# Patient Record
Sex: Male | Born: 1990 | Race: Black or African American | Hispanic: No | Marital: Single | Smoking: Current every day smoker
Health system: Southern US, Community
[De-identification: ages and names within clinical notes are randomized; demographics above are authoritative.]

## PROBLEM LIST (undated history)

## (undated) ENCOUNTER — Emergency Department (HOSPITAL_COMMUNITY): Payer: Self-pay | Source: Home / Self Care

## (undated) DIAGNOSIS — R569 Unspecified convulsions: Secondary | ICD-10-CM

## (undated) HISTORY — PX: OTHER SURGICAL HISTORY: SHX169

---
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
## 2013-01-18 ENCOUNTER — Encounter (HOSPITAL_COMMUNITY): Payer: Self-pay | Admitting: Emergency Medicine

## 2013-01-18 ENCOUNTER — Emergency Department (HOSPITAL_COMMUNITY)
Admission: EM | Admit: 2013-01-18 | Discharge: 2013-01-18 | Disposition: A | Discharge status: Home / Self Care | Payer: 59 | Attending: Emergency Medicine | Admitting: Emergency Medicine

## 2013-01-18 DIAGNOSIS — A084 Viral intestinal infection, unspecified: Secondary | ICD-10-CM

## 2013-01-18 DIAGNOSIS — A088 Other specified intestinal infections: Secondary | ICD-10-CM | POA: Insufficient documentation

## 2013-01-18 LAB — COMPREHENSIVE METABOLIC PANEL WITH GFR
ALT: 14 U/L (ref 0–53)
AST: 17 U/L (ref 0–37)
Albumin: 4.2 g/dL (ref 3.5–5.2)
Alkaline Phosphatase: 83 U/L (ref 39–117)
BUN: 12 mg/dL (ref 6–23)
CO2: 23 meq/L (ref 19–32)
Calcium: 9.6 mg/dL (ref 8.4–10.5)
Chloride: 100 meq/L (ref 96–112)
Creatinine, Ser: 0.93 mg/dL (ref 0.50–1.35)
GFR calc Af Amer: >90 mL/min (ref >90)
GFR calc non Af Amer: >90 mL/min (ref >90)
Glucose, Bld: 161 mg/dL — ABNORMAL HIGH (ref 70–99)
Potassium: 4 meq/L (ref 3.5–5.1)
Sodium: 137 meq/L (ref 135–145)
Total Bilirubin: 0.9 mg/dL (ref 0.3–1.2)
Total Protein: 8.5 g/dL — ABNORMAL HIGH (ref 6.0–8.3)

## 2013-01-18 LAB — CBC WITH DIFFERENTIAL/PLATELET
Basophils Absolute: 0 K/uL (ref 0.0–0.1)
Basophils Relative: 0 % (ref 0–1)
Eosinophils Absolute: 0 K/uL (ref 0.0–0.7)
Eosinophils Relative: 0 % (ref 0–5)
HCT: 42.3 % (ref 39.0–52.0)
Hemoglobin: 15.2 g/dL (ref 13.0–17.0)
Lymphocytes Relative: 4 % — ABNORMAL LOW (ref 12–46)
Lymphs Abs: 0.5 K/uL — ABNORMAL LOW (ref 0.7–4.0)
MCH: 31.3 pg (ref 26.0–34.0)
MCHC: 35.9 g/dL (ref 30.0–36.0)
MCV: 87 fL (ref 78.0–100.0)
Monocytes Absolute: 0.4 K/uL (ref 0.1–1.0)
Monocytes Relative: 3 % (ref 3–12)
Neutro Abs: 10.4 K/uL — ABNORMAL HIGH (ref 1.7–7.7)
Neutrophils Relative %: 92 % — ABNORMAL HIGH (ref 43–77)
Platelets: 297 K/uL (ref 150–400)
RBC: 4.86 MIL/uL (ref 4.22–5.81)
RDW: 12.3 % (ref 11.5–15.5)
WBC: 11.2 K/uL — ABNORMAL HIGH (ref 4.0–10.5)

## 2013-01-18 LAB — LIPASE, BLOOD: Lipase: 15 U/L (ref 11–59)

## 2013-01-18 MED ORDER — ONDANSETRON HCL 4 MG PO TABS: 4.0000 mg | ORAL_TABLET | Freq: Four times a day (QID) | ORAL | Status: DC

## 2013-01-18 MED ORDER — ONDANSETRON HCL 4 MG/2ML IJ SOLN
4.0000 mg | INTRAMUSCULAR | Status: AC
  Administered 2013-01-18: 4 mg via INTRAVENOUS
  Filled 2013-01-18: qty 2

## 2013-01-18 MED ORDER — MORPHINE SULFATE 4 MG/ML IJ SOLN
2.0000 mg | Freq: Once | INTRAMUSCULAR | Status: AC
  Administered 2013-01-18: 2 mg via INTRAVENOUS
  Filled 2013-01-18: qty 1

## 2013-01-18 MED ORDER — SODIUM CHLORIDE 0.9 % IV BOLUS (SEPSIS)
1000.0000 mL | Freq: Once | INTRAVENOUS | Status: AC
  Administered 2013-01-18: 1000 mL via INTRAVENOUS

## 2013-01-18 NOTE — ED Notes (Signed)
Pt. reports mid abdominal pain with emesis and diarrhea onset yesterday afternoon , denies fever or chills. Denies urinary discomfort.

## 2013-01-18 NOTE — ED Provider Notes (Signed)
Medical screening examination/treatment/procedure(s) were conducted as a shared visit with non-physician practitioner(s) or resident and myself. I personally evaluated the patient during the encounter and agree with the findings and plan unless otherwise indicated.  I have personally reviewed any xrays and/ or EKG's with the provider and I agree with interpretation.  Central adbo cramping with vomit/ diarrhea, significant other starting with similar sxs. Exam well appearing, mild dry mm, no focal abdo tenderness, no guarding, no RLQ pain. Likely GE. Discussed possible early appy and return instructions.  Fluids given. Lab work unremarkable.  Vomiting, Diarrhea   Enid Skeens, MD 01/18/13 [phone]

## 2013-01-18 NOTE — ED Provider Notes (Signed)
CSN: [id number]     Arrival date & time 01/18/13  7829 History   First MD Initiated Contact with Patient 01/18/13 0303     Chief Complaint  Patient presents with  . Abdominal Pain   (Consider location/radiation/quality/duration/timing/severity/associated sxs/prior Treatment) HPI Comments: Patient is a 22 year old male with no significant past medical history who presents for abdominal cramping with associated nonbloody, nonbilious emesis x5 and nonbloody, watery diarrhea x4. Symptoms began yesterday afternoon at 4 PM and have been persistent since. Patient denies any modifying factors of his symptoms and has not tried any over-the-counter remedies. He describes his abdominal cramping as diffuse and nonradiating. He denies associated fever, chills, shortness of breath, chest pain, urinary symptoms, melena or hematochezia, weakness, and numbness or tingling. Denies hx of abdominal surgeries. Denies sick contacts.  Patient is a 22 y.o. male presenting with abdominal pain. The history is provided by the patient. No language interpreter was used.  Abdominal Pain Associated symptoms: diarrhea, nausea and vomiting   Associated symptoms: no fever     History reviewed. No pertinent past medical history. History reviewed. No pertinent past surgical history. No family history on file. History  Substance Use Topics  . Smoking status: Never Smoker   . Smokeless tobacco: Not on file  . Alcohol Use: No    Review of Systems  Constitutional: Negative for fever.  Gastrointestinal: Positive for nausea, vomiting, abdominal pain and diarrhea. Negative for blood in stool.  All other systems reviewed and are negative.    Allergies  Review of patient's allergies indicates no known allergies.  Home Medications   Current Outpatient Rx  Name  Route  Sig  Dispense  Refill  . ondansetron (ZOFRAN) 4 MG tablet   Oral   Take 1 tablet (4 mg total) by mouth every 6 (six) hours.   12 tablet   0    BP  126/65  Pulse 79  Temp(Src) 97.7 F (36.5 C) (Oral)  Resp 16  SpO2 100%  Physical Exam  Nursing note and vitals reviewed. Constitutional: He is oriented to person, place, and time. He appears well-developed and well-nourished. No distress.  HENT:  Head: Normocephalic and atraumatic.  Mouth/Throat: Oropharynx is clear and moist. No oropharyngeal exudate.  Eyes: Conjunctivae and EOM are normal. Pupils are equal, round, and reactive to light. No scleral icterus.  Neck: Normal range of motion. Neck supple.  Cardiovascular: Normal rate, regular rhythm and normal heart sounds.   Pulmonary/Chest: Effort normal. No respiratory distress. He has no wheezes. He has no rales.  Abdominal: Soft. He exhibits no distension and no mass. There is no tenderness. There is no rebound and no guarding.  No peritoneal signs or guarding.  Musculoskeletal: Normal range of motion.  Neurological: He is alert and oriented to person, place, and time.  Skin: Skin is warm and dry. No rash noted. He is not diaphoretic. No erythema. No pallor.  Psychiatric: He has a normal mood and affect. His behavior is normal.    ED Course  Procedures (including critical care time) Labs Review Labs Reviewed  CBC WITH DIFFERENTIAL - Abnormal; Notable for the following:    WBC 11.2 (*)    Neutrophils Relative % 92 (*)    Neutro Abs 10.4 (*)    Lymphocytes Relative 4 (*)    Lymphs Abs 0.5 (*)    All other components within normal limits  COMPREHENSIVE METABOLIC PANEL - Abnormal; Notable for the following:    Glucose, Bld 161 (*)    Total  Protein 8.5 (*)    All other components within normal limits  LIPASE, BLOOD   Imaging Review No results found.  EKG Interpretation   None       MDM   1. Viral gastroenteritis    Patient presents today for abdominal cramping, nausea, vomiting, and diarrhea with onset 4 PM yesterday. Patient will and nontoxic appearing, hemodynamically stable, and afebrile. Physical exam without  abdominal tenderness. Heart RRR and lungs CTAB. Labs significant for mild leukocytosis of 11.2 consistent with viral illness. No electrolyte balance and liver and kidney function preserved. Lipase normal. Patient treated in ED with Zofran and IV fluids as well as morphine for pain and to slow down bowels. Will reassess.  Patient has remained stable throughout ED course. Abdominal examinations also stable without tenderness. Do not believe emergent imaging is indicated at this time. Believe he is stable for discharge with primary care followup. Will prescribe a course of Zofran for symptomatic management. Return precautions discussed and patient agreeable to plan with no unaddressed concerns.    Antony Madura, PA-C 01/18/13 [phone]

## 2013-06-23 ENCOUNTER — Emergency Department (HOSPITAL_COMMUNITY)
Admission: EM | Admit: 2013-06-23 | Discharge: 2013-06-23 | Disposition: A | Discharge status: Home / Self Care | Payer: 59 | Attending: Emergency Medicine | Admitting: Emergency Medicine

## 2013-06-23 ENCOUNTER — Encounter (HOSPITAL_COMMUNITY): Payer: Self-pay | Admitting: Emergency Medicine

## 2013-06-23 ENCOUNTER — Emergency Department (HOSPITAL_COMMUNITY): Payer: 59

## 2013-06-23 DIAGNOSIS — Y9302 Activity, running: Secondary | ICD-10-CM | POA: Insufficient documentation

## 2013-06-23 DIAGNOSIS — R296 Repeated falls: Secondary | ICD-10-CM | POA: Insufficient documentation

## 2013-06-23 DIAGNOSIS — X500XXA Overexertion from strenuous movement or load, initial encounter: Secondary | ICD-10-CM | POA: Insufficient documentation

## 2013-06-23 DIAGNOSIS — S43005A Unspecified dislocation of left shoulder joint, initial encounter: Secondary | ICD-10-CM

## 2013-06-23 DIAGNOSIS — S43016A Anterior dislocation of unspecified humerus, initial encounter: Secondary | ICD-10-CM | POA: Insufficient documentation

## 2013-06-23 DIAGNOSIS — Y929 Unspecified place or not applicable: Secondary | ICD-10-CM | POA: Insufficient documentation

## 2013-06-23 MED ORDER — ONDANSETRON HCL 4 MG/2ML IJ SOLN
4.0000 mg | Freq: Once | INTRAMUSCULAR | Status: AC
  Administered 2013-06-23: 4 mg via INTRAVENOUS
  Filled 2013-06-23: qty 2

## 2013-06-23 MED ORDER — ETOMIDATE 2 MG/ML IV SOLN
INTRAVENOUS | Status: AC | PRN
  Administered 2013-06-23: 10 mg via INTRAVENOUS

## 2013-06-23 MED ORDER — HYDROCODONE-ACETAMINOPHEN 5-325 MG PO TABS: 2.0000 | ORAL_TABLET | ORAL | Status: DC | PRN

## 2013-06-23 MED ORDER — ETOMIDATE 2 MG/ML IV SOLN
INTRAVENOUS | Status: AC
  Filled 2013-06-23: qty 10

## 2013-06-23 MED ORDER — HYDROMORPHONE HCL PF 1 MG/ML IJ SOLN
1.0000 mg | Freq: Once | INTRAMUSCULAR | Status: AC
  Administered 2013-06-23: 1 mg via INTRAVENOUS
  Filled 2013-06-23: qty 1

## 2013-06-23 MED ORDER — SODIUM CHLORIDE 0.9 % IV SOLN
INTRAVENOUS | Status: DC
  Administered 2013-06-23: 125 mL/h via INTRAVENOUS

## 2013-06-23 MED ORDER — FENTANYL CITRATE 0.05 MG/ML IJ SOLN
50.0000 ug | Freq: Once | INTRAMUSCULAR | Status: DC
  Filled 2013-06-23 (×2): qty 2

## 2013-06-23 MED ORDER — IBUPROFEN 800 MG PO TABS: 800.0000 mg | ORAL_TABLET | Freq: Three times a day (TID) | ORAL | Status: DC

## 2013-06-23 MED ORDER — ETOMIDATE 2 MG/ML IV SOLN: 0.3000 mg/kg | Freq: Once | INTRAVENOUS | Status: DC

## 2013-06-23 NOTE — ED Provider Notes (Signed)
CSN: [id number]     Arrival date & time 06/23/13  0020 History   First MD Initiated Contact with Patient 06/23/13 0036     Chief Complaint  Patient presents with  . Shoulder Injury     (Consider location/radiation/quality/duration/timing/severity/associated sxs/prior Treatment) HPI Otherwise healthy 23 year old male. History per patient. Patient states he was running up stairs, tripped with outstretched arm injuring his left shoulder. Now presents with pain and deformity to left shoulder and severe pain with any movement. No wrist or elbow injury. No head or neck injury. Denies LOC. Pain is sharp and moderate to severe. No history of previous dislocation. No numbness or tingling.  History reviewed. No pertinent past medical history. History reviewed. No pertinent past surgical history. History reviewed. No pertinent family history. History  Substance Use Topics  . Smoking status: Never Smoker   . Smokeless tobacco: Not on file  . Alcohol Use: No    Review of Systems  Constitutional: Negative for fever and chills.  Eyes: Negative for visual disturbance.  Respiratory: Negative for shortness of breath.   Cardiovascular: Negative for chest pain.  Gastrointestinal: Negative for vomiting and abdominal pain.  Genitourinary: Negative for flank pain.  Musculoskeletal: Negative for back pain, neck pain and neck stiffness.  Skin: Negative for rash.  Neurological: Negative for weakness, numbness and headaches.  All other systems reviewed and are negative.     Allergies  Review of patient's allergies indicates no known allergies.  Home Medications   Prior to Admission medications   Not on File   BP 130/75  Pulse 66  Temp(Src) 98.5 F (36.9 C) (Oral)  Resp 10  Wt 155 lb (70.308 kg)  SpO2 100% Physical Exam  Constitutional: He is oriented to person, place, and time. He appears well-developed and well-nourished.  HENT:  Head: Normocephalic and atraumatic.  Mouth/Throat:  Oropharynx is clear and moist.  Eyes: EOM are normal. Pupils are equal, round, and reactive to light.  Neck: Neck supple.  Cardiovascular: Normal rate, regular rhythm and intact distal pulses.   Pulmonary/Chest: Effort normal and breath sounds normal. No respiratory distress. He exhibits no tenderness.  Abdominal: There is no tenderness.  Musculoskeletal:  Left upper extremity with obvious deformity to the left shoulder consistent with dislocation. Clavicle intact and nontender. Normal sensorium to light touch over the deltoid. No tenderness over elbow, wrist or hand. Distal neurovascular intact. Decreased range of motion at shoulder.  Neurological: He is alert and oriented to person, place, and time.  Skin: Skin is warm and dry.    ED Course  Procedural sedation Date/Time: 06/23/2013 1:26 AM Performed by: Sunnie NielsenPITZ, Tifany Hirsch Authorized by: Sunnie NielsenPITZ, Alecia Doi Consent: Verbal consent obtained. Risks and benefits: risks, benefits and alternatives were discussed Consent given by: patient Patient understanding: patient states understanding of the procedure being performed Patient consent: the patient's understanding of the procedure matches consent given Procedure consent: procedure consent matches procedure scheduled Required items: required blood products, implants, devices, and special equipment available Patient identity confirmed: verbally with patient Time out: Immediately prior to procedure a "time out" was called to verify the correct patient, procedure, equipment, support staff and site/side marked as required. Preparation: Patient was prepped and draped in the usual sterile fashion. Patient sedated: yes Sedation type: moderate (conscious) sedation Sedatives: etomidate Sedation end date/time: 06/23/2013 1:26 AM Vitals: Vital signs were monitored during sedation. Patient tolerance: Patient tolerated the procedure well with no immediate complications.  Reduction of dislocation Date/Time:  06/23/2013 1:26 AM Performed by: Sunnie NielsenPITZ, Link Burgeson Authorized  by: Sunnie NielsenPITZ, Ha Placeres Consent: Verbal consent obtained. Risks and benefits: risks, benefits and alternatives were discussed Consent given by: patient Patient understanding: patient states understanding of the procedure being performed Patient consent: the patient's understanding of the procedure matches consent given Procedure consent: procedure consent matches procedure scheduled Required items: required blood products, implants, devices, and special equipment available Patient identity confirmed: verbally with patient Time out: Immediately prior to procedure a "time out" was called to verify the correct patient, procedure, equipment, support staff and site/side marked as required. Preparation: Patient was prepped and draped in the usual sterile fashion. Patient tolerance: Patient tolerated the procedure well with no immediate complications. Comments: Traction applied to arm/left shoulder with audible clunk and clinical reduction. X-ray ordered.   (including critical care time) Labs Review Labs Reviewed - No data to display  Imaging Review Dg Shoulder Left  06/23/2013   CLINICAL DATA:  Left shoulder pain  EXAM: LEFT SHOULDER - 2+ VIEW  COMPARISON:  None.  FINDINGS: There is anterior dislocation of the left humeral head relative to glenoid. There is no fracture. [facility] joint is normal.  IMPRESSION: Anterior left shoulder dislocation.   Electronically Signed   By: Elige KoHetal  Patel   On: 06/23/2013 01:06   Dg Shoulder Left Port  06/23/2013   CLINICAL DATA:  Postreduction of left shoulder dislocation  EXAM: PORTABLE LEFT SHOULDER - 2+ VIEW  COMPARISON:  None.  FINDINGS: There has been successful interval reduction of the left glenohumeral dislocation. The acromioclavicular joint is normal. There is slight flattening of the posterolateral humeral head which may reflect a subtle Hill-Sachs lesion.  IMPRESSION: Interval successful reduction of left  glenohumeral dislocation.   Electronically Signed   By: Elige KoHetal  Patel   On: 06/23/2013 02:06   IV fluids. IV Dilaudid  Plan discharge home with close outpatient followup orthopedic surgery. Prescription for Motrin pain medication as needed. Return precautions provided and verbalizes understood.  MDM   Diagnosis: Left shoulder dislocation  FOOSH injury to left upper extremity. Evaluated with x-ray reviewed as above. IV Dilaudid pain control. Patient consented for procedural sedation with left shoulder reduction. Patient was given etomidate, 10 mg with adequate sedation. No complications of sedation. Left shoulder easily reduced. Postreduction film obtained. Patient's significant other bedside is reliable adult and able to assume care at home. Orthopedic referral provided Prescription for pain medication provided Patient will wear a sling and no weightbearing until evaluated by orthopedics. Vital Signs and nursing notes reviewed and considered.  Sunnie NielsenBrian Gloris Shiroma, MD 06/23/13 [phone]

## 2013-06-23 NOTE — ED Notes (Signed)
Pt went to x-ray prior to getting medication. Pt has not received any medications for pain. Pt has someone to drive him home.

## 2013-06-23 NOTE — ED Notes (Signed)
Pt ambulated in hallway, tolerated well. Pt being driven home by significant other.

## 2013-06-23 NOTE — ED Notes (Signed)
Pt states that he landed with his left arm extended. Pt states that his left shoulder jolted and he has pain in his left shoulder. Unable to tell if shoulder is dislocated. Pt has bulges to his left shoulder but able to move arm.

## 2013-06-23 NOTE — Discharge Instructions (Signed)

## 2016-10-22 ENCOUNTER — Emergency Department (HOSPITAL_COMMUNITY)
Admission: EM | Admit: 2016-10-22 | Discharge: 2016-10-22 | Disposition: A | Discharge status: Home / Self Care | Payer: 59 | Attending: Emergency Medicine | Admitting: Emergency Medicine

## 2016-10-22 ENCOUNTER — Emergency Department (HOSPITAL_COMMUNITY): Payer: 59

## 2016-10-22 ENCOUNTER — Encounter (HOSPITAL_COMMUNITY): Payer: Self-pay | Admitting: *Deleted

## 2016-10-22 DIAGNOSIS — R112 Nausea with vomiting, unspecified: Secondary | ICD-10-CM | POA: Insufficient documentation

## 2016-10-22 LAB — COMPREHENSIVE METABOLIC PANEL
ALT: 13 U/L — AB (ref 17–63)
AST: 25 U/L (ref 15–41)
Albumin: 4.1 g/dL (ref 3.5–5.0)
Alkaline Phosphatase: 60 U/L (ref 38–126)
Anion gap: 10 (ref 5–15)
BUN: 6 mg/dL (ref 6–20)
CHLORIDE: 101 mmol/L (ref 101–111)
CO2: 24 mmol/L (ref 22–32)
Calcium: 9.2 mg/dL (ref 8.9–10.3)
Creatinine, Ser: 1.1 mg/dL (ref 0.61–1.24)
GFR calc Af Amer: >60 mL/min (ref >60)
GFR calc non Af Amer: >60 mL/min (ref >60)
Glucose, Bld: 162 mg/dL — ABNORMAL HIGH (ref 65–99)
Potassium: 3.5 mmol/L (ref 3.5–5.1)
SODIUM: 135 mmol/L (ref 135–145)
Total Bilirubin: 1.1 mg/dL (ref 0.3–1.2)
Total Protein: 8.1 g/dL (ref 6.5–8.1)

## 2016-10-22 LAB — CBC
HEMATOCRIT: 43.5 % (ref 39.0–52.0)
HEMOGLOBIN: 14.7 g/dL (ref 13.0–17.0)
MCH: 30.1 pg (ref 26.0–34.0)
MCHC: 33.8 g/dL (ref 30.0–36.0)
MCV: 89 fL (ref 78.0–100.0)
Platelets: 291 10*3/uL (ref 150–400)
RBC: 4.89 MIL/uL (ref 4.22–5.81)
RDW: 12.7 % (ref 11.5–15.5)
WBC: 12.8 10*3/uL — ABNORMAL HIGH (ref 4.0–10.5)

## 2016-10-22 LAB — URINALYSIS, ROUTINE W REFLEX MICROSCOPIC
Bilirubin Urine: NEGATIVE
GLUCOSE, UA: NEGATIVE mg/dL
HGB URINE DIPSTICK: NEGATIVE
Ketones, ur: 5 mg/dL — AB
Leukocytes, UA: NEGATIVE
Nitrite: NEGATIVE
PH: 6 (ref 5.0–8.0)
Protein, ur: NEGATIVE mg/dL
SPECIFIC GRAVITY, URINE: 1.02 (ref 1.005–1.030)

## 2016-10-22 LAB — LIPASE, BLOOD: LIPASE: 22 U/L (ref 11–51)

## 2016-10-22 MED ORDER — SODIUM CHLORIDE 0.9 % IV BOLUS (SEPSIS)
1000.0000 mL | Freq: Once | INTRAVENOUS | Status: AC
  Administered 2016-10-22: 1000 mL via INTRAVENOUS

## 2016-10-22 MED ORDER — KETOROLAC TROMETHAMINE 30 MG/ML IJ SOLN
30.0000 mg | Freq: Once | INTRAMUSCULAR | Status: AC
  Administered 2016-10-22: 30 mg via INTRAVENOUS
  Filled 2016-10-22: qty 1

## 2016-10-22 MED ORDER — ONDANSETRON HCL 4 MG/2ML IJ SOLN
4.0000 mg | Freq: Once | INTRAMUSCULAR | Status: AC
  Administered 2016-10-22: 4 mg via INTRAVENOUS
  Filled 2016-10-22: qty 2

## 2016-10-22 MED ORDER — ONDANSETRON 4 MG PO TBDP: ORAL_TABLET | ORAL | 0 refills | Status: DC

## 2016-10-22 NOTE — ED Provider Notes (Signed)
[facility]-EMERGENCY DEPT Provider Note   CSN: [id number] Arrival date & time: 10/22/16  0820     History   Chief Complaint Chief Complaint  Patient presents with  . Emesis    HPI Jason Davenport is a 26 y.o. male otherwise healthy here presenting with abdominal pain, chills, vomiting, sinus congestion, cough. Patient states that since last night he's been having subjective chills. He also has myalgias as well as sinus congestion and cough. He had several episodes of vomiting as well. Denies any recent travel or bad food. Patient states that his baby is sick with similar symptoms.   The history is provided by the patient.    History reviewed. No pertinent past medical history.  There are no active problems to display for this patient.   History reviewed. No pertinent surgical history.     Home Medications    Prior to Admission medications   Medication Sig Start Date End Date Taking? Authorizing Provider  HYDROcodone-acetaminophen (NORCO/VICODIN) 5-325 MG per tablet Take 2 tablets by mouth every 4 (four) hours as needed. 06/23/13   Sunnie Nielsenpitz, Brian, MD  ibuprofen (ADVIL,MOTRIN) 800 MG tablet Take 1 tablet (800 mg total) by mouth 3 (three) times daily. 06/23/13   Sunnie Nielsenpitz, Brian, MD    Family History No family history on file.  Social History Social History  Substance Use Topics  . Smoking status: Never Smoker  . Smokeless tobacco: Not on file  . Alcohol use No     Allergies   Patient has no known allergies.   Review of Systems Review of Systems  HENT: Positive for congestion.   Respiratory: Positive for cough.   Gastrointestinal: Positive for vomiting.  All other systems reviewed and are negative.    Physical Exam Updated Vital Signs BP 117/72 (BP Location: Right Arm)   Pulse 91   Temp 98.9 F (37.2 C) (Oral)   Resp 14   SpO2 99%   Physical Exam  Constitutional: He is oriented to person, place, and time.  Slightly dehydrated   HENT:  Head: Normocephalic.  OP  clear, tonsils with no exudates   Eyes: Pupils are equal, round, and reactive to light. Conjunctivae and EOM are normal.  Neck: Normal range of motion. Neck supple.  Cardiovascular: Normal rate, regular rhythm and normal heart sounds.   Pulmonary/Chest: Effort normal.  Diminished R base   Abdominal: Soft. Bowel sounds are normal.  Mild epigastric tenderness, no rebound   Musculoskeletal: Normal range of motion. He exhibits no edema.  Neurological: He is alert and oriented to person, place, and time. No cranial nerve deficit. Coordination normal.  Skin: Skin is warm.  Psychiatric: He has a normal mood and affect.  Nursing note and vitals reviewed.    ED Treatments / Results  Labs (all labs ordered are listed, but only abnormal results are displayed) Labs Reviewed  COMPREHENSIVE METABOLIC PANEL - Abnormal; Notable for the following:       Result Value   Glucose, Bld 162 (*)    ALT 13 (*)    All other components within normal limits  CBC - Abnormal; Notable for the following:    WBC 12.8 (*)    All other components within normal limits  URINALYSIS, ROUTINE W REFLEX MICROSCOPIC - Abnormal; Notable for the following:    Ketones, ur 5 (*)    All other components within normal limits  LIPASE, BLOOD    EKG  EKG Interpretation None       Radiology Dg Chest 2 View  Result Date: 10/22/2016 CLINICAL DATA:  Productive cough for 2 days. Possible fever chills last night. EXAM: CHEST  2 VIEW COMPARISON:  None. FINDINGS: Normal heart size and mediastinal contours. No acute infiltrate or edema. No effusion or pneumothorax. No osseous findings. IMPRESSION: Negative chest. Electronically Signed   By: Marnee Spring M.D.   On: 10/22/2016 12:25    Procedures Procedures (including critical care time)  Medications Ordered in ED Medications  sodium chloride 0.9 % bolus 1,000 mL (1,000 mLs Intravenous New Bag/Given 10/22/16 1212)  ondansetron (ZOFRAN) injection 4 mg (4 mg Intravenous Given  10/22/16 1212)  ketorolac (TORADOL) 30 MG/ML injection 30 mg (30 mg Intravenous Given 10/22/16 1212)     Initial Impression / Assessment and Plan / ED Course  I have reviewed the triage vital signs and the nursing notes.  Pertinent labs & imaging results that were available during my care of the patient were reviewed by me and considered in my medical decision making (see chart for details).     Jason Davenport is a 26 y.o. male here with cough, congestion, chills, vomiting. Likely viral gastro vs pneumonia vs viral syndrome. Appears slightly dehydrated but afebrile. Will get labs, CXR, CMP, lipase. Will hydrate and reassess.   1:05 PM  CXR clear. WBC 12. UA showed some ketones but no UTI. Felt better after IVF. No vomiting in the ED. Will dc home with zofran prn. Likely gastro.   Final Clinical Impressions(s) / ED Diagnoses   Final diagnoses:  None    New Prescriptions New Prescriptions   No medications on file     Charlynne Pander, MD 10/22/16 1306

## 2016-10-22 NOTE — ED Triage Notes (Signed)
Pt states that he began having abdominal pain, vomiting, headache and nasal congestion last night.

## 2016-10-22 NOTE — Discharge Instructions (Signed)
Stay hydrated.   Take tylenol, motrin for chills.   Take zofran as needed for nausea.   See your doctor   Return to ER if you have worse abdominal pain, vomiting, fever.

## 2017-06-15 ENCOUNTER — Emergency Department (HOSPITAL_COMMUNITY)
Admission: EM | Admit: 2017-06-15 | Discharge: 2017-06-15 | Disposition: A | Discharge status: Home / Self Care | Payer: 59 | Attending: Emergency Medicine | Admitting: Emergency Medicine

## 2017-06-15 ENCOUNTER — Encounter (HOSPITAL_COMMUNITY): Payer: Self-pay | Admitting: Emergency Medicine

## 2017-06-15 DIAGNOSIS — M545 Low back pain: Secondary | ICD-10-CM | POA: Insufficient documentation

## 2017-06-15 DIAGNOSIS — Z5321 Procedure and treatment not carried out due to patient leaving prior to being seen by health care provider: Secondary | ICD-10-CM | POA: Diagnosis not present
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
## 2018-01-30 ENCOUNTER — Emergency Department (HOSPITAL_COMMUNITY): Payer: Self-pay

## 2018-01-30 ENCOUNTER — Other Ambulatory Visit: Payer: Self-pay

## 2018-01-30 ENCOUNTER — Encounter (HOSPITAL_COMMUNITY): Payer: Self-pay | Admitting: Emergency Medicine

## 2018-01-30 ENCOUNTER — Emergency Department (HOSPITAL_COMMUNITY)
Admission: EM | Admit: 2018-01-30 | Discharge: 2018-01-30 | Disposition: A | Discharge status: Home / Self Care | Payer: Self-pay | Attending: Emergency Medicine | Admitting: Emergency Medicine

## 2018-01-30 DIAGNOSIS — Y9389 Activity, other specified: Secondary | ICD-10-CM | POA: Insufficient documentation

## 2018-01-30 DIAGNOSIS — W06XXXA Fall from bed, initial encounter: Secondary | ICD-10-CM | POA: Insufficient documentation

## 2018-01-30 DIAGNOSIS — S43015A Anterior dislocation of left humerus, initial encounter: Secondary | ICD-10-CM | POA: Insufficient documentation

## 2018-01-30 DIAGNOSIS — M542 Cervicalgia: Secondary | ICD-10-CM | POA: Insufficient documentation

## 2018-01-30 DIAGNOSIS — R51 Headache: Secondary | ICD-10-CM | POA: Insufficient documentation

## 2018-01-30 DIAGNOSIS — F17200 Nicotine dependence, unspecified, uncomplicated: Secondary | ICD-10-CM | POA: Insufficient documentation

## 2018-01-30 DIAGNOSIS — Y92003 Bedroom of unspecified non-institutional (private) residence as the place of occurrence of the external cause: Secondary | ICD-10-CM | POA: Insufficient documentation

## 2018-01-30 DIAGNOSIS — Y999 Unspecified external cause status: Secondary | ICD-10-CM | POA: Insufficient documentation

## 2018-01-30 DIAGNOSIS — R569 Unspecified convulsions: Secondary | ICD-10-CM | POA: Insufficient documentation

## 2018-01-30 DIAGNOSIS — R21 Rash and other nonspecific skin eruption: Secondary | ICD-10-CM | POA: Insufficient documentation

## 2018-01-30 LAB — RAPID URINE DRUG SCREEN, HOSP PERFORMED
AMPHETAMINES: NOT DETECTED
BARBITURATES: NOT DETECTED
BENZODIAZEPINES: NOT DETECTED
COCAINE: NOT DETECTED
Opiates: NOT DETECTED
TETRAHYDROCANNABINOL: POSITIVE — AB

## 2018-01-30 LAB — CBC WITH DIFFERENTIAL/PLATELET
Abs Immature Granulocytes: 0.1 10*3/uL — ABNORMAL HIGH (ref 0.00–0.07)
BASOS ABS: 0.1 10*3/uL (ref 0.0–0.1)
Basophils Relative: 0 %
EOS ABS: 0 10*3/uL (ref 0.0–0.5)
EOS PCT: 0 %
HCT: 51.2 % (ref 39.0–52.0)
Hemoglobin: 16.3 g/dL (ref 13.0–17.0)
IMMATURE GRANULOCYTES: 1 %
Lymphocytes Relative: 7 %
Lymphs Abs: 1.2 10*3/uL (ref 0.7–4.0)
MCH: 29 pg (ref 26.0–34.0)
MCHC: 31.8 g/dL (ref 30.0–36.0)
MCV: 90.9 fL (ref 80.0–100.0)
Monocytes Absolute: 0.6 10*3/uL (ref 0.1–1.0)
Monocytes Relative: 4 %
NEUTROS PCT: 88 %
NRBC: 0 % (ref 0.0–0.2)
Neutro Abs: 15.4 10*3/uL — ABNORMAL HIGH (ref 1.7–7.7)
Platelets: 374 10*3/uL (ref 150–400)
RBC: 5.63 MIL/uL (ref 4.22–5.81)
RDW: 12.5 % (ref 11.5–15.5)
WBC: 17.4 10*3/uL — AB (ref 4.0–10.5)

## 2018-01-30 LAB — COMPREHENSIVE METABOLIC PANEL
ALBUMIN: 4.4 g/dL (ref 3.5–5.0)
ALT: 24 U/L (ref 0–44)
ANION GAP: 15 (ref 5–15)
AST: 35 U/L (ref 15–41)
Alkaline Phosphatase: 65 U/L (ref 38–126)
BUN: 11 mg/dL (ref 6–20)
CO2: 21 mmol/L — AB (ref 22–32)
Calcium: 9.9 mg/dL (ref 8.9–10.3)
Chloride: 100 mmol/L (ref 98–111)
Creatinine, Ser: 1.19 mg/dL (ref 0.61–1.24)
GFR calc Af Amer: >60 mL/min (ref >60)
GFR calc non Af Amer: >60 mL/min (ref >60)
GLUCOSE: 222 mg/dL — AB (ref 70–99)
POTASSIUM: 4.9 mmol/L (ref 3.5–5.1)
SODIUM: 136 mmol/L (ref 135–145)
Total Bilirubin: 0.9 mg/dL (ref 0.3–1.2)
Total Protein: 8.7 g/dL — ABNORMAL HIGH (ref 6.5–8.1)

## 2018-01-30 LAB — CBG MONITORING, ED: GLUCOSE-CAPILLARY: 165 mg/dL — AB (ref 70–99)

## 2018-01-30 LAB — URINALYSIS, ROUTINE W REFLEX MICROSCOPIC
BILIRUBIN URINE: NEGATIVE
GLUCOSE, UA: NEGATIVE mg/dL
HGB URINE DIPSTICK: NEGATIVE
KETONES UR: 20 mg/dL — AB
Leukocytes, UA: NEGATIVE
Nitrite: NEGATIVE
PROTEIN: NEGATIVE mg/dL
Specific Gravity, Urine: 1.017 (ref 1.005–1.030)
pH: 5 (ref 5.0–8.0)

## 2018-01-30 LAB — ETHANOL: Alcohol, Ethyl (B): <10 mg/dL (ref <10)

## 2018-01-30 MED ORDER — FENTANYL CITRATE (PF) 100 MCG/2ML IJ SOLN
100.0000 ug | Freq: Once | INTRAMUSCULAR | Status: AC
  Administered 2018-01-30: 100 ug via INTRAVENOUS
  Filled 2018-01-30: qty 2

## 2018-01-30 MED ORDER — KETAMINE HCL 50 MG/5ML IJ SOSY
0.5000 mg/kg | PREFILLED_SYRINGE | Freq: Once | INTRAMUSCULAR | Status: DC
  Filled 2018-01-30: qty 5

## 2018-01-30 MED ORDER — IBUPROFEN 600 MG PO TABS: 600.0000 mg | ORAL_TABLET | Freq: Four times a day (QID) | ORAL | 0 refills | Status: DC | PRN

## 2018-01-30 MED ORDER — SODIUM CHLORIDE 0.9 % IV BOLUS
1000.0000 mL | Freq: Once | INTRAVENOUS | Status: AC
  Administered 2018-01-30: 1000 mL via INTRAVENOUS

## 2018-01-30 MED ORDER — FENTANYL CITRATE (PF) 100 MCG/2ML IJ SOLN
INTRAMUSCULAR | Status: AC | PRN
  Administered 2018-01-30: 50 ug via INTRAVENOUS

## 2018-01-30 MED ORDER — PROPOFOL 10 MG/ML IV BOLUS
INTRAVENOUS | Status: AC | PRN
  Administered 2018-01-30: 40 mg via INTRAVENOUS
  Administered 2018-01-30 (×2): 30 mg via INTRAVENOUS

## 2018-01-30 MED ORDER — PROPOFOL 10 MG/ML IV BOLUS
0.5000 mg/kg | Freq: Once | INTRAVENOUS | Status: DC
  Filled 2018-01-30: qty 20

## 2018-01-30 MED ORDER — PROPOFOL 10 MG/ML IV BOLUS
INTRAVENOUS | Status: AC | PRN
  Administered 2018-01-30 (×2): 40 mg via INTRAVENOUS

## 2018-01-30 MED ORDER — FENTANYL CITRATE (PF) 100 MCG/2ML IJ SOLN
100.0000 ug | Freq: Once | INTRAMUSCULAR | Status: DC
  Filled 2018-01-30: qty 2

## 2018-01-30 MED ORDER — KETAMINE HCL 10 MG/ML IJ SOLN
INTRAMUSCULAR | Status: AC | PRN
  Administered 2018-01-30: 15 mg via INTRAVENOUS
  Administered 2018-01-30: 35 mg via INTRAVENOUS

## 2018-01-30 NOTE — Discharge Instructions (Addendum)
We are not sure exactly what caused your seizure today but the rest of your work-up has been reassuring, the CT scan of your head and neck does not show any acute abnormalities.  Seizures Precautions: (for 6 months or until you are seen by neurology) - no driving or swimming - no activity that would be dangerous if yu were to have another seizure  Call to schedule schedule follow-up appointment with neurology regarding her seizure today.  Your shoulder was dislocated from the fall today.  Your repeat XR showed a small fracture. This will likely heal on its own. But continue wearing the sling. Please follow-up with Dr. Luiz BlareGraves with orthopedics, you will need to remain in shoulder immobilizer until you are seen by Dr. Luiz BlareGraves.  You may use ibuprofen and prescribed pain medication as needed as well as alternating ice and heat.

## 2018-01-30 NOTE — ED Provider Notes (Signed)
[facility] EMERGENCY DEPARTMENT Provider Note   CSN: [id number] Arrival date & time: 01/30/18  8295     History   Chief Complaint Chief Complaint  Patient presents with  . Seizures    HPI Jason Davenport is a 27 y.o. male.  Jason Davenport is a 27 y.o. male who is otherwise healthy, presents to the emergency department via EMS for evaluation after witnessed seizure.  Per EMS patient's girlfriend reported 1/32 witnessed seizure where patient had generalized jerking motions, this resolved on its own and afterwards the patient was postictal with some confusion, when EMS arrived on the scene patient was still slightly groggy but able to answer questions appropriately.  Girlfriend reported that when he started to seize he fell off the bed landing on his left side, they initially noted some left shoulder deformity and pain, patient was given 200 mcgs of fentanyl with improvement in pain. Pt still unable to move shoulder. Patient also thinks he hit his head and is having some mild pain in his neck.  He has been nauseated without any episodes of vomiting since seizure.  He denies any fevers or recent illness, no chest pain or shortness of breath, no abdominal pain.  He does not have any history of previous seizures and denies any other medical problems.  He reports he smokes cigarettes and marijuana but denies any other drug or alcohol use.  Has been feeling well for the past few days no symptoms prior to seizure.     History reviewed. No pertinent past medical history.  There are no active problems to display for this patient.   History reviewed. No pertinent surgical history.      Home Medications    Prior to Admission medications   Medication Sig Start Date End Date Taking? Authorizing Provider  HYDROcodone-acetaminophen (NORCO/VICODIN) 5-325 MG per tablet Take 2 tablets by mouth every 4 (four) hours as needed. Patient not taking: Reported on 01/30/2018 06/23/13   Sunnie Nielsen, MD  ibuprofen (ADVIL,MOTRIN) 800 MG tablet Take 1 tablet (800 mg total) by mouth 3 (three) times daily. Patient not taking: Reported on 01/30/2018 06/23/13   Sunnie Nielsen, MD  ondansetron [facility] ODT) 4 MG disintegrating tablet 4mg  ODT q6 hours prn nausea/vomit Patient not taking: Reported on 01/30/2018 10/22/16   Charlynne Pander, MD    Family History No family history on file.  Social History Social History   Tobacco Use  . Smoking status: Current Every Day Smoker  . Smokeless tobacco: Never Used  Substance Use Topics  . Alcohol use: No  . Drug use: No     Allergies   Patient has no known allergies.   Review of Systems Review of Systems   Physical Exam Updated Vital Signs BP 122/74 (BP Location: Right Arm)   Pulse 61   Temp (!) 97.3 F (36.3 C) (Oral)   Resp 14   Ht 5\' 11"  (1.803 m)   Wt 63.5 kg   SpO2 95%   BMI 19.53 kg/m   Physical Exam Vitals signs and nursing note reviewed.  Constitutional:      General: He is not in acute distress.    Appearance: Normal appearance. He is normal weight. He is not ill-appearing or diaphoretic.  HENT:     Head: Normocephalic and atraumatic.     Mouth/Throat:     Mouth: Mucous membranes are moist.     Pharynx: Oropharynx is clear.  Eyes:     General:  Right eye: No discharge.        Left eye: No discharge.     Extraocular Movements: Extraocular movements intact.     Conjunctiva/sclera: Conjunctivae normal.     Pupils: Pupils are equal, round, and reactive to light.     Comments: No nystagmus  Neck:     Musculoskeletal: Normal range of motion and neck supple. No neck rigidity.     Comments: Mild tenderness over the back of the neck without appreciable deformity or step-off, no lateral neck tenderness Cardiovascular:     Rate and Rhythm: Normal rate and regular rhythm.     Pulses: Normal pulses.     Heart sounds: Normal heart sounds. No murmur. No friction rub. No gallop.   Pulmonary:     Effort:  Pulmonary effort is normal. No respiratory distress.     Breath sounds: Normal breath sounds. No stridor. No wheezing, rhonchi or rales.     Comments: Respirations equal and unlabored, patient able to speak in full sentences, lungs clear to auscultation bilaterally Abdominal:     General: Abdomen is flat. Bowel sounds are normal.     Palpations: Abdomen is soft.  Musculoskeletal:     Comments: Tenderness to palpation over the left shoulder with anterior deformity, patient unable to move due to pain, 2+ radial pulse and good capillary refill, normal sensation throughout the arm, normal range of motion at the elbow and wrist. All the joints supple and easily movable, all compartments soft.  No midline thoracic or lumbar spine tenderness.  Skin:    General: Skin is warm and dry.     Capillary Refill: Capillary refill takes less than 2 seconds.     Findings: Rash present.     Comments: Erythematous macular rash noted to the shoulders and back which patient reports is been present for several months, is not itchy or painful.  Neurological:     Mental Status: He is alert and oriented to person, place, and time. Mental status is at baseline.     Comments: Speech is clear, able to follow commands CN III-XII intact Normal strength in upper and lower extremities bilaterally including dorsiflexion and plantar flexion, strong and equal grip strength Sensation normal to light and sharp touch Moves extremities without ataxia, coordination intact Normal finger to nose and rapid alternating movements No pronator drift  Psychiatric:        Mood and Affect: Mood normal.        Behavior: Behavior normal.      ED Treatments / Results  Labs (all labs ordered are listed, but only abnormal results are displayed) Labs Reviewed  CBC WITH DIFFERENTIAL/PLATELET - Abnormal; Notable for the following components:      Result Value   WBC 17.4 (*)    Neutro Abs 15.4 (*)    Abs Immature Granulocytes 0.10 (*)      All other components within normal limits  COMPREHENSIVE METABOLIC PANEL - Abnormal; Notable for the following components:   CO2 21 (*)    Glucose, Bld 222 (*)    Total Protein 8.7 (*)    All other components within normal limits  CBG MONITORING, ED - Abnormal; Notable for the following components:   Glucose-Capillary 165 (*)    All other components within normal limits  ETHANOL  RAPID URINE DRUG SCREEN, HOSP PERFORMED  URINALYSIS, ROUTINE W REFLEX MICROSCOPIC    EKG None  Radiology Dg Chest 2 View  Result Date: 01/30/2018 CLINICAL DATA:  Seizure.  Pain after  fall. EXAM: CHEST - 2 VIEW COMPARISON:  None. FINDINGS: Suspected left shoulder dislocation. See the dedicated shoulder films for further evaluation. The heart, hila, mediastinum, lungs, and pleura are otherwise unremarkable. IMPRESSION: Suspected left shoulder anterior dislocation. See the dedicated films of the shoulder for further evaluation. Electronically Signed   By: Gerome Sam III M.D   On: 01/30/2018 10:32   Ct Head Wo Contrast  Result Date: 01/30/2018 CLINICAL DATA:  Seizure, fell out of bed landing on RIGHT side on floor, nausea, head trauma during fall EXAM: CT HEAD WITHOUT CONTRAST CT CERVICAL SPINE WITHOUT CONTRAST TECHNIQUE: Multidetector CT imaging of the head and cervical spine was performed following the standard protocol without intravenous contrast. Multiplanar CT image reconstructions of the cervical spine were also generated. COMPARISON:  None FINDINGS: CT HEAD FINDINGS Brain: Normal ventricular morphology. No midline shift or mass effect. Normal appearance of brain parenchyma. No intracranial hemorrhage, mass lesion, evidence of acute infarction, or extra-axial fluid collection. Vascular: Unremarkable Skull: Normal mineralization, intact Sinuses/Orbits: Mucosal retention cyst LEFT maxillary sinus. Remaining visualized paranasal sinuses and mastoid air cells clear. Other: N/A CT CERVICAL SPINE FINDINGS  Alignment: Normal Skull base and vertebrae: Osseous mineralization normal. Skull base intact. Vertebral body and disc space heights maintained. No fracture, subluxation, or bone destruction. Soft tissues and spinal canal: Prevertebral soft tissues normal thickness. Prominent adenoids and parapharyngeal tonsillar tissue. Remaining soft tissues unremarkable. Disc levels:  No significant abnormalities Upper chest: Lung apices clear Other: N/A IMPRESSION: Normal CT head. Normal CT cervical spine. Electronically Signed   By: Ulyses Southward M.D.   On: 01/30/2018 11:46   Ct Cervical Spine Wo Contrast  Result Date: 01/30/2018 CLINICAL DATA:  Seizure, fell out of bed landing on RIGHT side on floor, nausea, head trauma during fall EXAM: CT HEAD WITHOUT CONTRAST CT CERVICAL SPINE WITHOUT CONTRAST TECHNIQUE: Multidetector CT imaging of the head and cervical spine was performed following the standard protocol without intravenous contrast. Multiplanar CT image reconstructions of the cervical spine were also generated. COMPARISON:  None FINDINGS: CT HEAD FINDINGS Brain: Normal ventricular morphology. No midline shift or mass effect. Normal appearance of brain parenchyma. No intracranial hemorrhage, mass lesion, evidence of acute infarction, or extra-axial fluid collection. Vascular: Unremarkable Skull: Normal mineralization, intact Sinuses/Orbits: Mucosal retention cyst LEFT maxillary sinus. Remaining visualized paranasal sinuses and mastoid air cells clear. Other: N/A CT CERVICAL SPINE FINDINGS Alignment: Normal Skull base and vertebrae: Osseous mineralization normal. Skull base intact. Vertebral body and disc space heights maintained. No fracture, subluxation, or bone destruction. Soft tissues and spinal canal: Prevertebral soft tissues normal thickness. Prominent adenoids and parapharyngeal tonsillar tissue. Remaining soft tissues unremarkable. Disc levels:  No significant abnormalities Upper chest: Lung apices clear Other:  N/A IMPRESSION: Normal CT head. Normal CT cervical spine. Electronically Signed   By: Ulyses Southward M.D.   On: 01/30/2018 11:46   Dg Shoulder Left  Result Date: 01/30/2018 CLINICAL DATA:  Pain after fall EXAM: LEFT SHOULDER - 2+ VIEW COMPARISON:  None. FINDINGS: There is an anterior left shoulder dislocation. No fractures identified. IMPRESSION: Anterior left shoulder dislocation. Electronically Signed   By: Gerome Sam III M.D   On: 01/30/2018 10:33    Procedures Procedures (including critical care time)  Medications Ordered in ED Medications  propofol (DIPRIVAN) 10 mg/mL bolus/IV push 31.8 mg (has no administration in time range)  ketamine 50 mg in normal saline 5 mL (10 mg/mL) syringe (has no administration in time range)  fentaNYL (SUBLIMAZE) injection 100 mcg (has  no administration in time range)  propofol (DIPRIVAN) 10 mg/mL bolus/IV push 31.8 mg (has no administration in time range)  sodium chloride 0.9 % bolus 1,000 mL (0 mLs Intravenous Stopped 01/30/18 1337)  fentaNYL (SUBLIMAZE) injection 100 mcg (100 mcg Intravenous Given 01/30/18 1133)  fentaNYL (SUBLIMAZE) injection 100 mcg (100 mcg Intravenous Given 01/30/18 1206)  propofol (DIPRIVAN) 10 mg/mL bolus/IV push (40 mg Intravenous Given 01/30/18 1243)     Initial Impression / Assessment and Plan / ED Course  I have reviewed the triage vital signs and the nursing notes.  Pertinent labs & imaging results that were available during my care of the patient were reviewed by me and considered in my medical decision making (see chart for details).  27 yo otherwise healthy male presents with 30 sec sewed of witnessed seizure-like activity with postictal period.  No prior history of seizures and denies drug or alcohol use aside from marijuana.  No recent illnesses or infections, unclear etiology for seizure.  Patient is now alert and oriented, with no focal neurologic deficits on exam.  During seizure patient did fall off the bed onto  his left side and is complaining of severe left shoulder pain and an inability to move it, concerning for shoulder dislocation, he has dislocated the shoulder previously about 4 years ago.  Patient also thinks he may have hit his head or neck and is having some mild pain there.  Denies any pain elsewhere, slight nausea without vomiting.  Afebrile with normal vitals on arrival.  Will get CT of the head and neck, chest x-ray and x-ray of the left shoulder, basic labs, urinalysis urine drug screen, ethanol level, and will monitor for any further seizure activity.  IV fluid bolus given.  Normal CBG.  Left shoulder x-ray shows anterior dislocation with no fracture, the left upper extremity remains neurovascularly intact will plan for conscious sedation and reduction.  CT of the head and neck show no acute abnormality, no acute cardiopulmonary disease on chest x-ray.  Labs show leukocytosis of 17.4, patient is not having any fever cough, urinary symptoms or other signs of infection I suspect this is acute reaction from seizure, normal hemoglobin, glucose of 222 but no other acute electrolyte derangements requiring intervention, normal renal and liver function.  Negative ethanol level.  Awaiting urinalysis and UDS.  12:45 PM initial attempt at reduction of left anterior shoulder dislocation with propofol conscious sedation with Dr. Rosalia Hammers was unsuccessful.  Patient does have history of prior dislocation on the shoulder, chart review reveals left shoulder dislocation in 2015 which was reduced under conscious sedation here in the emergency department patient followed up with Dr. Luiz Blare.  Will plan for second attempt at reduction with propofol and ketamine.  Pt has been unable to provide urine sample.  3:30 PM second attempt at left shoulder reduction with ketamine and propofol was successful.  Post reduction films reviewed by myself and show resolution of anterior dislocation, awaiting formal radiology read. Reports  significant improvement in pain, he was placed in a shoulder immobilizer. Please see Dr. Denny Levy note regarding conscious sedation  At shift change care signed out to PA Gwendalyn Ege who will follow-up on patient reevaluate after sedation has worn off and follow-up on results of urinalysis and UDS.  I provided patient with seizure precautions and he will need to follow-up with neurology as well as orthopedics.  Patient expresses understanding and is in agreement with plan.  Final Clinical Impressions(s) / ED Diagnoses   Final diagnoses:  Seizure-like activity (HCC)  Anterior dislocation of left shoulder, initial encounter    ED Discharge Orders    None       Legrand Rams 01/31/18 2238    Margarita Grizzle, MD 02/14/18 [phone]

## 2018-01-30 NOTE — ED Notes (Signed)
Called Respiratory

## 2018-01-30 NOTE — Progress Notes (Signed)
Orthopedic Tech Progress Note Patient Details:  Ozzie HoyleMontez Borawski 1990/12/31 [id number]  Ortho Devices Type of Ortho Device: Shoulder immobilizer       Saul FordyceJennifer C Pinchas Reither 01/30/2018, 2:51 PM

## 2018-01-30 NOTE — ED Notes (Signed)
Pt tried to urinate with no luck said will try later.

## 2018-01-30 NOTE — ED Triage Notes (Signed)
Per [facility], pt picked up from home due to grand mal  seizure witnessed by girlfriend.  States he seized for approx 30secs.  Denies hx of seizure. EMS arrived and Pt was postictal. Arrived to ED with l shoulder deformity.  Given fentanyl PTA.  Arrived to ED alert and oriented x3.

## 2018-01-30 NOTE — ED Provider Notes (Signed)
.  Sedation Date/Time: 01/30/2018 4:25 PM Performed by: Margarita Grizzleay, Milta Croson, MD Authorized by: Margarita Grizzleay, Hafsa Lohn, MD   Consent:    Consent obtained:  Written   Consent given by:  Patient   Risks discussed:  Prolonged hypoxia resulting in organ damage, dysrhythmia, prolonged sedation necessitating reversal and respiratory compromise necessitating ventilatory assistance and intubation Universal protocol:    Imaging studies available: yes     Immediately prior to procedure a time out was called: yes   Indications:    Procedure performed:  Dislocation reduction   Procedure necessitating sedation performed by:  Different physician Pre-sedation assessment:    Time since last food or drink:  8 hours   NPO status caution: unable to specify NPO status     ASA classification: class 1 - normal, healthy patient     Neck mobility: normal     Mouth opening:  3 or more finger widths   Mallampati score:  I - soft palate, uvula, fauces, pillars visible   Pre-sedation assessments completed and reviewed: airway patency     Pre-sedation assessment completed:  01/30/2018 3:00 PM Immediate pre-procedure details:    Reassessment: Patient reassessed immediately prior to procedure     Reviewed: vital signs   Procedure details (see MAR for exact dosages):    Preoxygenation:  Room air   Sedation:  Propofol and ketamine   Intra-procedure monitoring:  Continuous capnometry, continuous pulse oximetry and cardiac monitor   Intra-procedure events: none     Intra-procedure management:  Airway repositioning   Total Provider sedation time (minutes):  30 Post-procedure details:    Post-sedation assessment completed:  01/30/2018 4:27 PM      Margarita Grizzleay, Jacalyn Biggs, MD 01/30/18 [id number]

## 2018-01-30 NOTE — ED Provider Notes (Signed)
Care assumed [facility]  [facility], PA-C at shift change with x-rays pending.   In brief, this patient is a 27 y.o. M who presented for seizure.  In the process, he had an anterior dislocation of his left shoulder.  Please see note from previous provider for full history/physical exam.  PLAN: Patient had reduction of shoulder.  He is pending repeat x-ray. Plan for dispo.   MDM: X-ray reviewed.  Successful reduction of left shoulder.  There is mention of a Hill-Sachs impaction fracture of the left humeral head.  Discussed results with patient.  Instructed him to follow-up with orthopedics regarding this.  Instructed patient on seizure precautions.  Gave him outpatient neuro referral. Patient had ample opportunity for questions and discussion. All patient's questions were answered with full understanding. .ladlc    Plan to follow up with PCP as needed and return precautions discussed for worsening or new concerning symptoms.    1. Seizure-like activity (HCC)   2. Anterior dislocation of left shoulder, initial encounter      Maxwell CaulLayden, Vasil Juhasz A, PA-C 01/30/18 1630    Loren RacerYelverton, David, MD 01/30/18 [phone]

## 2018-01-30 NOTE — ED Notes (Signed)
Pt left earrings. Given to security

## 2018-01-30 NOTE — ED Notes (Signed)
Ortho tech made aware of shoulder dislocation procedure

## 2018-07-21 ENCOUNTER — Emergency Department (HOSPITAL_COMMUNITY): Payer: Self-pay

## 2018-07-21 ENCOUNTER — Encounter (HOSPITAL_COMMUNITY): Payer: Self-pay

## 2018-07-21 ENCOUNTER — Other Ambulatory Visit: Payer: Self-pay

## 2018-07-21 ENCOUNTER — Emergency Department (HOSPITAL_COMMUNITY)
Admission: EM | Admit: 2018-07-21 | Discharge: 2018-07-21 | Disposition: A | Discharge status: Home / Self Care | Payer: Self-pay | Attending: Emergency Medicine | Admitting: Emergency Medicine

## 2018-07-21 DIAGNOSIS — Y929 Unspecified place or not applicable: Secondary | ICD-10-CM | POA: Insufficient documentation

## 2018-07-21 DIAGNOSIS — X500XXA Overexertion from strenuous movement or load, initial encounter: Secondary | ICD-10-CM | POA: Insufficient documentation

## 2018-07-21 DIAGNOSIS — Y939 Activity, unspecified: Secondary | ICD-10-CM | POA: Insufficient documentation

## 2018-07-21 DIAGNOSIS — S43005A Unspecified dislocation of left shoulder joint, initial encounter: Secondary | ICD-10-CM | POA: Insufficient documentation

## 2018-07-21 DIAGNOSIS — F17210 Nicotine dependence, cigarettes, uncomplicated: Secondary | ICD-10-CM | POA: Insufficient documentation

## 2018-07-21 DIAGNOSIS — Y999 Unspecified external cause status: Secondary | ICD-10-CM | POA: Insufficient documentation

## 2018-07-21 MED ORDER — PROPOFOL 10 MG/ML IV BOLUS
INTRAVENOUS | Status: AC | PRN
  Administered 2018-07-21 (×2): 10 mg via INTRAVENOUS
  Administered 2018-07-21 (×2): 20 mg via INTRAVENOUS
  Administered 2018-07-21: 10 mg via INTRAVENOUS
  Administered 2018-07-21 (×2): 20 mg via INTRAVENOUS
  Administered 2018-07-21: 10 mg via INTRAVENOUS

## 2018-07-21 MED ORDER — PROPOFOL 10 MG/ML IV BOLUS
0.5000 mg/kg | Freq: Once | INTRAVENOUS | Status: AC
  Administered 2018-07-21: 40 mg via INTRAVENOUS
  Filled 2018-07-21 (×2): qty 20

## 2018-07-21 MED ORDER — KETAMINE HCL 50 MG/5ML IJ SOSY
30.0000 mg | PREFILLED_SYRINGE | Freq: Once | INTRAMUSCULAR | Status: AC
  Administered 2018-07-21: 30 mg via INTRAVENOUS
  Filled 2018-07-21: qty 5

## 2018-07-21 MED ORDER — HYDROMORPHONE HCL 1 MG/ML IJ SOLN
1.0000 mg | Freq: Once | INTRAMUSCULAR | Status: AC
  Administered 2018-07-21: 1 mg via INTRAVENOUS
  Filled 2018-07-21: qty 1

## 2018-07-21 NOTE — Progress Notes (Signed)
Orthopedic Tech Progress Note Patient Details:  Jason Davenport 1990/08/22 [id number]  Ortho Devices Type of Ortho Device: Shoulder immobilizer Ortho Device/Splint Location: left Ortho Device/Splint Interventions: Application   Post Interventions Patient Tolerated: Well Instructions Provided: Care of device   Saul Fordyce 07/21/2018, 6:39 PM

## 2018-07-21 NOTE — Sedation Documentation (Signed)
Pts left shoulder reduced by Dr Clarene Duke

## 2018-07-21 NOTE — Sedation Documentation (Signed)
Pt sipping on sprite; talking on the phone

## 2018-07-21 NOTE — ED Provider Notes (Signed)
[facility] EMERGENCY DEPARTMENT Provider Note   CSN: [id number] Arrival date & time: 07/21/18  1651    History   Chief Complaint Chief Complaint  Patient presents with  . Dislocation    shoulder    HPI Jason Davenport is a 28 y.o. male.     28yo M who p/w L shoulder dislocation. Pt notes that 5-6 months ago, he dislocated his left shoulder during a seizure. It was reduced and he never required further intervention. Today, just prior to arrival, he was stretching and yawning when his left shoulder spontaneously dislocated. He denies trauma. He reports some tingling in his left hand. He has not eaten anything today. No meds PTA.   The history is provided by the patient.    History reviewed. No pertinent past medical history.  There are no active problems to display for this patient.   History reviewed. No pertinent surgical history.      Home Medications    Prior to Admission medications   Medication Sig Start Date End Date Taking? Authorizing Provider  HYDROcodone-acetaminophen (NORCO/VICODIN) 5-325 MG per tablet Take 2 tablets by mouth every 4 (four) hours as needed. Patient not taking: Reported on 01/30/2018 06/23/13   Sunnie Nielsen, MD  ibuprofen (ADVIL,MOTRIN) 600 MG tablet Take 1 tablet (600 mg total) by mouth every 6 (six) hours as needed. 01/30/18   Maxwell Caul, PA-C  ondansetron (ZOFRAN ODT) 4 MG disintegrating tablet 4mg  ODT q6 hours prn nausea/vomit Patient not taking: Reported on 01/30/2018 10/22/16   Charlynne Pander, MD    Family History No family history on file.  Social History Social History   Tobacco Use  . Smoking status: Current Every Day Smoker  . Smokeless tobacco: Never Used  Substance Use Topics  . Alcohol use: No  . Drug use: No     Allergies   Patient has no known allergies.   Review of Systems Review of Systems  Constitutional: Negative for fever.  Respiratory: Negative for cough.   Musculoskeletal:  Positive for joint swelling.  Skin: Negative for wound.  Neurological: Negative for numbness.     Physical Exam Updated Vital Signs BP 123/79   Pulse 67   Temp 98.8 F (37.1 C) (Oral)   Resp 16   Wt 64 kg   SpO2 98%   BMI 19.67 kg/m   Physical Exam Vitals signs and nursing note reviewed.  Constitutional:      Appearance: He is well-developed.     Comments: In distress due to pain  HENT:     Head: Normocephalic and atraumatic.  Eyes:     Conjunctiva/sclera: Conjunctivae normal.  Neck:     Musculoskeletal: Neck supple.  Cardiovascular:     Pulses: Normal pulses.  Musculoskeletal:        General: Tenderness present.     Comments: L shoulder squared off compared to R with shoulder held in adduction, flexed at elbow; normal sensation fingers  Skin:    General: Skin is warm and dry.  Neurological:     Mental Status: He is alert and oriented to person, place, and time.     Sensory: No sensory deficit.  Psychiatric:        Judgment: Judgment normal.      ED Treatments / Results  Labs (all labs ordered are listed, but only abnormal results are displayed) Labs Reviewed - No data to display  EKG None  Radiology Dg Shoulder Left  Result Date: 07/21/2018 CLINICAL DATA:  LEFT shoulder dislocation EXAM: LEFT SHOULDER - 2+ VIEW COMPARISON:  01/30/2018 FINDINGS: Anterior LEFT glenohumeral dislocation identified, recurrent. Deformity of the humeral head consistent with Hill-Sachs impaction deformity. No additional fractures identified. Osseous mineralization normal. AC joint alignment normal. Visualized ribs intact. IMPRESSION: Recurrent anterior LEFT glenohumeral dislocation. Electronically Signed   By: Ulyses Southward M.D.   On: 07/21/2018 17:45   Dg Shoulder Left Portable  Result Date: 07/21/2018 CLINICAL DATA:  Post reduction radiographs. EXAM: LEFT SHOULDER - 1 VIEW COMPARISON:  07/21/2018 FINDINGS: The alignment is improved. There is some mild anterior and superior  subluxation of the humeral head with respect to the glenoid. There is a new small osseous fragment projecting inferior to the glenoid. This was not present on radiographs from 01/30/2018. This may represent a small avulsion fracture. A Hill-Sachs deformity is again noted. IMPRESSION: 1. Interval reduction of the previously demonstrated anterior glenohumeral dislocation. There is some mild residual anterior and superior subluxation of the humeral head, which is likely chronic. 2. New small osseous fragment inferior to the glenoid may represent a small avulsion injury. 3. A Hill-Sachs deformity is again noted of the humeral head. Electronically Signed   By: Katherine Mantle M.D.   On: 07/21/2018 19:16    Procedures .Sedation Date/Time: 07/21/2018 6:56 PM Performed by: Laurence Spates, MD Authorized by: Laurence Spates, MD   Consent:    Consent obtained:  Written   Consent given by:  Patient   Risks discussed:  Inadequate sedation, nausea, vomiting, respiratory compromise necessitating ventilatory assistance and intubation, prolonged sedation necessitating reversal and prolonged hypoxia resulting in organ damage   Alternatives discussed:  Analgesia without sedation Universal protocol:    Immediately prior to procedure a time out was called: yes     Patient identity confirmation method:  Verbally with patient Indications:    Procedure performed:  Dislocation reduction   Procedure necessitating sedation performed by:  Physician performing sedation Pre-sedation assessment:    Time since last food or drink:  4 hours   NPO status caution: urgency dictates proceeding with non-ideal NPO status     ASA classification: class 1 - normal, healthy patient     Neck mobility: normal     Mouth opening:  3 or more finger widths   Thyromental distance:  4 finger widths   Mallampati score:  I - soft palate, uvula, fauces, pillars visible   Pre-sedation assessments completed and reviewed: airway  patency, cardiovascular function, mental status, pain level and respiratory function   Immediate pre-procedure details:    Reassessment: Patient reassessed immediately prior to procedure     Reviewed: vital signs     Verified: bag valve mask available, emergency equipment available, intubation equipment available, IV patency confirmed, oxygen available and suction available   Procedure details (see MAR for exact dosages):    Preoxygenation:  Nasal cannula   Sedation:  Ketamine and propofol   Intra-procedure monitoring:  Blood pressure monitoring, cardiac monitor, continuous pulse oximetry, continuous capnometry, frequent LOC assessments and frequent vital sign checks   Intra-procedure events: none     Total Provider sedation time (minutes):  15 Post-procedure details:    Attendance: Constant attendance by certified staff until patient recovered     Recovery: Patient returned to pre-procedure baseline     Post-sedation assessments completed and reviewed: airway patency, cardiovascular function, mental status, pain level and respiratory function     Patient tolerance:  Tolerated well, no immediate complications Reduction of dislocation Date/Time: 07/21/2018 6:58 PM Performed  by: Laurence Spates,  Morgan, MD Authorized by: Laurence Spates,  Morgan, MD  Consent: Written consent obtained. Consent given by: patient Patient understanding: patient states understanding of the procedure being performed Patient consent: the patient's understanding of the procedure matches consent given Procedure consent: procedure consent matches procedure scheduled Relevant documents: relevant documents present and verified Imaging studies: imaging studies available Required items: required blood products, implants, devices, and special equipment available Patient identity confirmed: verbally with patient Time out: Immediately prior to procedure a "time out" was called to verify the correct patient, procedure, equipment,  support staff and site/side marked as required. Local anesthesia used: no  Anesthesia: Local anesthesia used: no  Sedation: Patient sedated: yes Sedation type: moderate (conscious) sedation Sedatives: ketamine and propofol Vitals: Vital signs were monitored during sedation.  Patient tolerance: Patient tolerated the procedure well with no immediate complications Comments: Reduced left shoulder with traction and external rotation    (including critical care time)  Medications Ordered in ED Medications  HYDROmorphone (DILAUDID) injection 1 mg (1 mg Intravenous Given 07/21/18 1712)  HYDROmorphone (DILAUDID) injection 1 mg (1 mg Intravenous Given 07/21/18 1721)  ketamine 50 mg in normal saline 5 mL (10 mg/mL) syringe (30 mg Intravenous Given 07/21/18 1815)  propofol (DIPRIVAN) 10 mg/mL bolus/IV push 32 mg (40 mg Intravenous Given 07/21/18 1815)  propofol (DIPRIVAN) 10 mg/mL bolus/IV push (20 mg Intravenous Given 07/21/18 1825)     Initial Impression / Assessment and Plan / ED Course  I have reviewed the triage vital signs and the nursing notes.  Pertinent imaging results that were available during my care of the patient were reviewed by me and considered in my medical decision making (see chart for details).       Neurovascularly intact distally. Films show anterior dislocation. Discussed options, proceeded with reduction under procedural sedation. See procedure notes for details. Post-reduction films show reduction of dislocation, chronic subluxation of head, possible tiny avulsion fragment, and chronic Hill-Sachs deformity. Pt placed in shoulder immobilizer. I discussed need for ortho f/u as he will likely need MRI given repeat dislocation of same shoulder. Discussed supportive measures. Provided with 5 minutes of smoking cessation counseling, especially given his likely need for future surgery. He voiced understanding.  Final Clinical Impressions(s) / ED Diagnoses   Final diagnoses:   Dislocation of left shoulder joint, initial encounter    ED Discharge Orders    None       , Ambrose Finlandachel Morgan, MD 07/21/18 1945

## 2018-07-21 NOTE — ED Triage Notes (Signed)
Pt reports left shoulder dislocation- reports hx of the same.  Pulses noted.  Sensation intact

## 2018-07-21 NOTE — Sedation Documentation (Signed)
Xray at bedside for post-reduction film

## 2018-07-21 NOTE — Sedation Documentation (Signed)
Pt talking, shaking from the ketamine.  Ortho applied arm sling

## 2018-07-23 ENCOUNTER — Emergency Department (HOSPITAL_COMMUNITY): Payer: Self-pay

## 2018-07-23 ENCOUNTER — Other Ambulatory Visit: Payer: Self-pay

## 2018-07-23 ENCOUNTER — Encounter (HOSPITAL_COMMUNITY): Payer: Self-pay

## 2018-07-23 ENCOUNTER — Emergency Department (HOSPITAL_COMMUNITY)
Admission: EM | Admit: 2018-07-23 | Discharge: 2018-07-23 | Disposition: A | Discharge status: Home / Self Care | Payer: Self-pay | Attending: Emergency Medicine | Admitting: Emergency Medicine

## 2018-07-23 DIAGNOSIS — Y999 Unspecified external cause status: Secondary | ICD-10-CM | POA: Insufficient documentation

## 2018-07-23 DIAGNOSIS — F17200 Nicotine dependence, unspecified, uncomplicated: Secondary | ICD-10-CM | POA: Insufficient documentation

## 2018-07-23 DIAGNOSIS — Y92009 Unspecified place in unspecified non-institutional (private) residence as the place of occurrence of the external cause: Secondary | ICD-10-CM | POA: Insufficient documentation

## 2018-07-23 DIAGNOSIS — X500XXA Overexertion from strenuous movement or load, initial encounter: Secondary | ICD-10-CM | POA: Insufficient documentation

## 2018-07-23 DIAGNOSIS — M25512 Pain in left shoulder: Secondary | ICD-10-CM

## 2018-07-23 DIAGNOSIS — Y9389 Activity, other specified: Secondary | ICD-10-CM | POA: Insufficient documentation

## 2018-07-23 DIAGNOSIS — S43005A Unspecified dislocation of left shoulder joint, initial encounter: Secondary | ICD-10-CM | POA: Insufficient documentation

## 2018-07-23 MED ORDER — PROPOFOL 10 MG/ML IV BOLUS
2.0000 mg/kg | Freq: Once | INTRAVENOUS | Status: AC
  Administered 2018-07-23: 127 mg via INTRAVENOUS
  Filled 2018-07-23: qty 20

## 2018-07-23 MED ORDER — HYDROMORPHONE HCL 1 MG/ML IJ SOLN
1.0000 mg | Freq: Once | INTRAMUSCULAR | Status: AC
  Administered 2018-07-23: 1 mg via INTRAVENOUS
  Filled 2018-07-23: qty 1

## 2018-07-23 MED ORDER — FENTANYL CITRATE (PF) 100 MCG/2ML IJ SOLN
50.0000 ug | Freq: Once | INTRAMUSCULAR | Status: AC
  Administered 2018-07-23: 50 ug via INTRAVENOUS
  Filled 2018-07-23: qty 2

## 2018-07-23 MED ORDER — ONDANSETRON HCL 4 MG/2ML IJ SOLN
4.0000 mg | Freq: Once | INTRAMUSCULAR | Status: AC
  Administered 2018-07-23: 4 mg via INTRAVENOUS
  Filled 2018-07-23: qty 2

## 2018-07-23 MED ORDER — LIDOCAINE HCL (PF) 2 % IJ SOLN
10.0000 mL | Freq: Once | INTRAMUSCULAR | Status: AC
  Administered 2018-07-23: 10 mL via INTRADERMAL
  Filled 2018-07-23: qty 10

## 2018-07-23 NOTE — ED Notes (Addendum)
Dr. Reather Converse, Dr. Myriam Jacobson, [place], RT at bedside. Time out preformed. Confirmed all necessary equipment present.  2034 30 mg propofol given pt A&O x 4 2035 30 mg propofol given pt A&O x 4 2036 30 mg propofol given pt A&O x 4 2038 30 mg propofol given pt A&O x 4 2039 30 mg propofol per Dr. Reather Converse given, pt A&O x 4 2040 20 mg propofol per Dr. Reather Converse given, sedation achieved. 2041 reduction completed.   2042 3L O2 applied for O2 sat of 80% on RA w/ improvement of O2 sat to 96%. 2045 Pt A&O x 4.   2052 X-ray at bedside to obtain post reduction film.

## 2018-07-23 NOTE — Discharge Instructions (Addendum)

## 2018-07-23 NOTE — ED Notes (Addendum)
Conscious sedation consent signed by patient. CO2 monitor in place, pt on five lead, ambu bag at bedside, suction set up, confirmed IV patent, crash cart at doorway.  Will inform MD and RT.

## 2018-07-23 NOTE — ED Triage Notes (Signed)
Pt stated that around 1730 he was stretching & his Lt shoulder popped out of joint, he stated this is the second time in 3 days.

## 2018-07-23 NOTE — ED Provider Notes (Signed)
[place] EM[facility] DEPARTMENT Provider Note   CSN: [id number] Arrival date & time: 07/23/18  1806    History   Chief Complaint Chief Complaint  Patient presents with  . Shoulder Pain    HPI Jason Davenport is a 28 y.o. male.     The history is provided by the patient and medical records.  Shoulder Pain  Location:  Shoulder Shoulder location:  L shoulder Injury: no   Pain details:    Quality:  Aching, dull and cramping   Radiates to:  Does not radiate   Severity:  Severe   Onset quality:  Sudden   Duration:  1 hour   Timing:  Constant   Progression:  Unchanged Foreign body present:  No foreign bodies Tetanus status:  Up to date Prior injury to area:  Yes Relieved by:  Nothing Worsened by:  Exercise, movement, stress and stretching area Ineffective treatments:  Rest and immobilization Associated symptoms: decreased range of motion, numbness, stiffness and tingling   Risk factors: no concern for non-accidental trauma and no known bone disorder     History reviewed. No pertinent past medical history.  There are no active problems to display for this patient.   History reviewed. No pertinent surgical history.      Home Medications    Prior to Admission medications   Medication Sig Start Date End Date Taking? Authorizing Provider  HYDROcodone-acetaminophen (NORCO/VICODIN) 5-325 MG per tablet Take 2 tablets by mouth every 4 (four) hours as needed. Patient not taking: Reported on 01/30/2018 06/23/13   Teressa Lower, MD  ibuprofen (ADVIL,MOTRIN) 600 MG tablet Take 1 tablet (600 mg total) by mouth every 6 (six) hours as needed. 01/30/18   Volanda Napoleon, PA-C  ondansetron (ZOFRAN ODT) 4 MG disintegrating tablet 4mg  ODT q6 hours prn nausea/vomit Patient not taking: Reported on 01/30/2018 10/22/16   Drenda Freeze, MD    Family History History reviewed. No pertinent family history.  Social History Social History   Tobacco Use  . Smoking  status: Current Every Day Smoker  . Smokeless tobacco: Never Used  Substance Use Topics  . Alcohol use: No  . Drug use: No     Allergies   Patient has no known allergies.   Review of Systems Review of Systems  Musculoskeletal: Positive for stiffness.  All other systems reviewed and are negative.    Physical Exam Updated Vital Signs BP 129/81 (BP Location: Right Arm)   Pulse 94   Temp 98.2 F (36.8 C) (Oral)   Resp 11   Ht 5\' 11"  (1.803 m)   Wt 63.5 kg   SpO2 100%   BMI 19.53 kg/m   Physical Exam Vitals signs and nursing note reviewed.  Constitutional:      Appearance: He is well-developed.  HENT:     Head: Normocephalic and atraumatic.  Eyes:     Conjunctiva/sclera: Conjunctivae normal.  Neck:     Musculoskeletal: Neck supple.  Cardiovascular:     Rate and Rhythm: Normal rate and regular rhythm.     Heart sounds: No murmur.  Pulmonary:     Effort: Pulmonary effort is normal. No respiratory distress.     Breath sounds: Normal breath sounds.  Abdominal:     Palpations: Abdomen is soft.     Tenderness: There is no abdominal tenderness.  Musculoskeletal:        General: Tenderness and signs of injury present.     Comments: Left shoulder squared off, palpable deformity, humerus  not located in glenoid  Grip strength appropriately bilateral hands, sensation intact bilateral hands, 2+ radial pulses bilateral hands  Range of motion decreased left shoulder secondary to pain  Skin:    General: Skin is warm and dry.  Neurological:     Mental Status: He is alert.      ED Treatments / Results  Labs (all labs ordered are listed, but only abnormal results are displayed) Labs Reviewed - No data to display  EKG None  Radiology Dg Shoulder Left Portable  Result Date: 07/23/2018 CLINICAL DATA:  Post reduction EXAM: LEFT SHOULDER - 1 VIEW COMPARISON:  Same day radiographs FINDINGS: Interval reduction of the left glenohumeral joint, which is now in anatomic  apposition. A large Hill-Sachs deformity of the humeral head and a bony Bankart fracture fragment of the inferior glenoid are again noted. There is unchanged widening of the acromioclavicular interval as appreciated on prior radiographs. IMPRESSION: Interval reduction of the left glenohumeral joint, which is now in anatomic apposition. A large Hill-Sachs deformity of the humeral head and a bony Bankart fracture fragment of the inferior glenoid are again noted. There is unchanged widening of the acromioclavicular interval as appreciated on prior radiographs. Electronically Signed   By: Lauralyn PrimesAlex  Bibbey M.D.   On: 07/23/2018 21:22   Dg Shoulder Left Portable  Result Date: 07/23/2018 CLINICAL DATA:  Left shoulder dislocation EXAM: LEFT SHOULDER - 1 VIEW COMPARISON:  07/21/2018 FINDINGS: Recurrent anterior dislocation of the left glenohumeral joint with a large Hill-Sachs deformity of the humeral head. A bony Bankart fracture of the anterior inferior glenoid is poorly appreciated due to position of the humerus. IMPRESSION: Recurrent anterior dislocation of the left glenohumeral joint with a large Hill-Sachs deformity of the humeral head. A bony Bankart fracture of the anterior inferior glenoid is poorly appreciated due to position of the humerus. Electronically Signed   By: Lauralyn PrimesAlex  Bibbey M.D.   On: 07/23/2018 19:11    Procedures .Sedation Date/Time: 07/23/2018 9:16 PM Performed by: Erick Alleyasey, Masato Pettie, MD Authorized by: Blane OharaZavitz, Joshua, MD   Consent:    Consent obtained:  Written   Consent given by:  Patient   Risks discussed:  Dysrhythmia, allergic reaction, inadequate sedation, nausea, prolonged hypoxia resulting in organ damage, prolonged sedation necessitating reversal and vomiting   Alternatives discussed:  Analgesia without sedation Universal protocol:    Procedure explained and questions answered to patient or proxy's satisfaction: yes     Relevant documents present and verified: yes     Test results  available and properly labeled: yes     Imaging studies available: yes     Required blood products, implants, devices, and special equipment available: yes     Site/side marked: yes     Immediately prior to procedure a time out was called: yes     Patient identity confirmation method:  Arm band and verbally with patient Indications:    Procedure performed:  Dislocation reduction   Procedure necessitating sedation performed by:  Different physician Pre-sedation assessment:    Time since last food or drink:  4 hours   ASA classification: class 1 - normal, healthy patient     Neck mobility: normal     Mouth opening:  3 or more finger widths   Thyromental distance:  4 finger widths   Mallampati score:  I - soft palate, uvula, fauces, pillars visible   Pre-sedation assessments completed and reviewed: airway patency, cardiovascular function, hydration status, mental status, nausea/vomiting, pain level and respiratory function  Immediate pre-procedure details:    Reassessment: Patient reassessed immediately prior to procedure     Reviewed: vital signs and relevant labs/tests     Verified: bag valve mask available, emergency equipment available, intubation equipment available, IV patency confirmed, oxygen available and suction available   Procedure details (see MAR for exact dosages):    Preoxygenation:  Nasal cannula   Sedation:  Propofol   Intra-procedure monitoring:  Blood pressure monitoring, cardiac monitor, continuous capnometry, continuous pulse oximetry, frequent LOC assessments and frequent vital sign checks   Intra-procedure events: none     Total Provider sedation time (minutes):  15 Post-procedure details:    Post-sedation assessment completed:  07/23/2018 9:22 PM   Attendance: Constant attendance by certified staff until patient recovered     Recovery: Patient returned to pre-procedure baseline     Post-sedation assessments completed and reviewed: airway patency not reviewed,  cardiovascular function not reviewed, hydration status not reviewed, mental status not reviewed, nausea/vomiting not reviewed, pain level not reviewed and respiratory function not reviewed     Patient is stable for discharge or admission: yes     Patient tolerance:  Tolerated well, no immediate complications Reduction of dislocation Date/Time: 07/23/2018 9:23 PM Performed by: Erick Alleyasey, Melvin Whiteford, MD Authorized by: Blane OharaZavitz, Joshua, MD  Consent: Written consent obtained. Risks and benefits: risks, benefits and alternatives were discussed Consent given by: patient Patient understanding: patient states understanding of the procedure being performed Patient consent: the patient's understanding of the procedure matches consent given Procedure consent: procedure consent matches procedure scheduled Relevant documents: relevant documents present and verified Test results: test results available and properly labeled Site marked: the operative site was marked Imaging studies: imaging studies available Required items: required blood products, implants, devices, and special equipment available Patient identity confirmed: verbally with patient and arm band Time out: Immediately prior to procedure a "time out" was called to verify the correct patient, procedure, equipment, support staff and site/side marked as required. Local anesthesia used: yes  Anesthesia: Local anesthesia used: yes Local Anesthetic: lidocaine 2% without epinephrine Anesthetic total: 5 mL  Sedation: Patient sedated: yes Sedation type: moderate (conscious) sedation Sedatives: propofol Vitals: Vital signs were monitored during sedation.  Patient tolerance: Patient tolerated the procedure well with no immediate complications    (including critical care time)  Medications Ordered in ED Medications  lidocaine (XYLOCAINE) 2 % injection 10 mL (10 mLs Intradermal Given 07/23/18 1920)  HYDROmorphone (DILAUDID) injection 1 mg (1 mg  Intravenous Given 07/23/18 1849)  ondansetron (ZOFRAN) injection 4 mg (4 mg Intravenous Given 07/23/18 1847)  fentaNYL (SUBLIMAZE) injection 50 mcg (50 mcg Intravenous Given 07/23/18 2000)  propofol (DIPRIVAN) 10 mg/mL bolus/IV push 127 mg (127 mg Intravenous Given 07/23/18 2029)     Initial Impression / Assessment and Plan / ED Course  I have reviewed the triage vital signs and the nursing notes.  Pertinent labs & imaging results that were available during my care of the patient were reviewed by me and considered in my medical decision making (see chart for details).         Medical Decision Making: Jason Davenport is a 28 y.o. male who presented to the ED today with left shoulder pain.  Past medical history significant for shoulder dislocation x2, last occurred 2 days ago Reviewed and confirmed nursing documentation for past medical history, family history, social history.  On my initial exam, the pt was appears uncomfortable, not hypotensive, heart rate 104 bpm, afebrile, no increased work of breathing or respiratory  distress, alert and interactive, conversant.   Clinical picture could be secondary recurrent dislocation, will obtain plain films to assess, neurovascularly intact distally, range of motion and strength decreased secondary to pain Plain film shows dislocation All radiology and laboratory studies reviewed independently and with my attending physician, agree with reading provided by radiologist unless otherwise noted.  Sedation and reduction performed, please see procedure notes for details, patient tolerated well Details from procedures: 2034 30 mg propofol given pt A&O x 4 2035 30 mg propofol given pt A&O x 4 2036 30 mg propofol given pt A&O x 4 2038 30 mg propofol given pt A&O x 4 2039 30 mg propofol per Dr. Jodi MourningZavitz given, pt A&O x 4 2040 20 mg propofol per Dr. Jodi MourningZavitz given, sedation achieved. 2041 reduction completed.   2042 3L O2 applied for O2 sat of 80% on RA w/  improvement of O2 sat to 96%. 2045 Pt A&O x 4.   2052 X-ray at bedside to obtain post reduction film.  Upon reassessing patient, patient was calm, no new complaints, alert and interactive, able to tolerate p.o. intake without emesis. Based on the above findings, I believe patient is hemodynamically stable for discharge  Patient educated about specific return precautions for given chief complaint and symptoms.  Patient educated about follow-up with PCP and orthopedics.  Patient expressed understanding of return precautions and need for follow-up.  Patient discharged. The above care was discussed with and agreed upon by my attending physician.  Emergency Department Medication Summary:  Medications  lidocaine (XYLOCAINE) 2 % injection 10 mL (10 mLs Intradermal Given 07/23/18 1920)  HYDROmorphone (DILAUDID) injection 1 mg (1 mg Intravenous Given 07/23/18 1849)  ondansetron (ZOFRAN) injection 4 mg (4 mg Intravenous Given 07/23/18 1847)  fentaNYL (SUBLIMAZE) injection 50 mcg (50 mcg Intravenous Given 07/23/18 2000)  propofol (DIPRIVAN) 10 mg/mL bolus/IV push 127 mg (127 mg Intravenous Given 07/23/18 2029)    Final Clinical Impressions(s) / ED Diagnoses   Final diagnoses:  Acute pain of left shoulder    ED Discharge Orders    None       Erick Alleyasey, Nesha Counihan, MD 07/23/18 2320    Blane OharaZavitz, Joshua, MD 07/24/18 0028

## 2018-08-17 ENCOUNTER — Emergency Department (HOSPITAL_COMMUNITY): Payer: Self-pay

## 2018-08-17 ENCOUNTER — Encounter (HOSPITAL_COMMUNITY): Payer: Self-pay | Admitting: *Deleted

## 2018-08-17 ENCOUNTER — Observation Stay (HOSPITAL_COMMUNITY): Payer: Self-pay

## 2018-08-17 ENCOUNTER — Other Ambulatory Visit: Payer: Self-pay

## 2018-08-17 ENCOUNTER — Observation Stay (HOSPITAL_COMMUNITY)
Admission: EM | Admit: 2018-08-17 | Discharge: 2018-08-18 | Disposition: A | Discharge status: Home / Self Care | Payer: Self-pay | Attending: Internal Medicine | Admitting: Internal Medicine

## 2018-08-17 DIAGNOSIS — F1910 Other psychoactive substance abuse, uncomplicated: Secondary | ICD-10-CM

## 2018-08-17 DIAGNOSIS — S0083XA Contusion of other part of head, initial encounter: Secondary | ICD-10-CM | POA: Insufficient documentation

## 2018-08-17 DIAGNOSIS — Y999 Unspecified external cause status: Secondary | ICD-10-CM | POA: Insufficient documentation

## 2018-08-17 DIAGNOSIS — S43015A Anterior dislocation of left humerus, initial encounter: Principal | ICD-10-CM | POA: Insufficient documentation

## 2018-08-17 DIAGNOSIS — F17200 Nicotine dependence, unspecified, uncomplicated: Secondary | ICD-10-CM | POA: Insufficient documentation

## 2018-08-17 DIAGNOSIS — X58XXXA Exposure to other specified factors, initial encounter: Secondary | ICD-10-CM | POA: Insufficient documentation

## 2018-08-17 DIAGNOSIS — S43005A Unspecified dislocation of left shoulder joint, initial encounter: Secondary | ICD-10-CM

## 2018-08-17 DIAGNOSIS — G40909 Epilepsy, unspecified, not intractable, without status epilepticus: Secondary | ICD-10-CM | POA: Insufficient documentation

## 2018-08-17 DIAGNOSIS — R569 Unspecified convulsions: Secondary | ICD-10-CM

## 2018-08-17 DIAGNOSIS — Z20828 Contact with and (suspected) exposure to other viral communicable diseases: Secondary | ICD-10-CM | POA: Insufficient documentation

## 2018-08-17 DIAGNOSIS — Y9389 Activity, other specified: Secondary | ICD-10-CM | POA: Insufficient documentation

## 2018-08-17 DIAGNOSIS — Y92230 Patient room in hospital as the place of occurrence of the external cause: Secondary | ICD-10-CM | POA: Insufficient documentation

## 2018-08-17 HISTORY — DX: Unspecified convulsions: R56.9

## 2018-08-17 LAB — CBC WITH DIFFERENTIAL/PLATELET
Abs Immature Granulocytes: 0.26 10*3/uL — ABNORMAL HIGH (ref 0.00–0.07)
Basophils Absolute: 0 10*3/uL (ref 0.0–0.1)
Basophils Relative: 0 %
Eosinophils Absolute: 0 10*3/uL (ref 0.0–0.5)
Eosinophils Relative: 0 %
HCT: 55.5 % — ABNORMAL HIGH (ref 39.0–52.0)
Hemoglobin: 17.7 g/dL — ABNORMAL HIGH (ref 13.0–17.0)
Immature Granulocytes: 2 %
Lymphocytes Relative: 14 %
Lymphs Abs: 2.1 10*3/uL (ref 0.7–4.0)
MCH: 30.5 pg (ref 26.0–34.0)
MCHC: 31.9 g/dL (ref 30.0–36.0)
MCV: 95.7 fL (ref 80.0–100.0)
Monocytes Absolute: 1.3 10*3/uL — ABNORMAL HIGH (ref 0.1–1.0)
Monocytes Relative: 9 %
Neutro Abs: 11.5 10*3/uL — ABNORMAL HIGH (ref 1.7–7.7)
Neutrophils Relative %: 75 %
Platelets: 406 10*3/uL — ABNORMAL HIGH (ref 150–400)
RBC: 5.8 MIL/uL (ref 4.22–5.81)
RDW: 13.4 % (ref 11.5–15.5)
WBC: 15.2 10*3/uL — ABNORMAL HIGH (ref 4.0–10.5)
nRBC: 0 % (ref 0.0–0.2)

## 2018-08-17 LAB — URINALYSIS, ROUTINE W REFLEX MICROSCOPIC
Bilirubin Urine: NEGATIVE
Glucose, UA: NEGATIVE mg/dL
Ketones, ur: 20 mg/dL — AB
Leukocytes,Ua: NEGATIVE
Nitrite: NEGATIVE
Protein, ur: 100 mg/dL — AB
Specific Gravity, Urine: 1.015 (ref 1.005–1.030)
pH: 5 (ref 5.0–8.0)

## 2018-08-17 LAB — COMPREHENSIVE METABOLIC PANEL
ALT: 26 U/L (ref 0–44)
AST: 37 U/L (ref 15–41)
Albumin: 4.7 g/dL (ref 3.5–5.0)
Alkaline Phosphatase: 81 U/L (ref 38–126)
Anion gap: 24 — ABNORMAL HIGH (ref 5–15)
BUN: 7 mg/dL (ref 6–20)
CO2: 10 mmol/L — ABNORMAL LOW (ref 22–32)
Calcium: 9.9 mg/dL (ref 8.9–10.3)
Chloride: 103 mmol/L (ref 98–111)
Creatinine, Ser: 1.45 mg/dL — ABNORMAL HIGH (ref 0.61–1.24)
GFR calc Af Amer: >60 mL/min (ref >60)
GFR calc non Af Amer: >60 mL/min (ref >60)
Glucose, Bld: 217 mg/dL — ABNORMAL HIGH (ref 70–99)
Potassium: 3.5 mmol/L (ref 3.5–5.1)
Sodium: 137 mmol/L (ref 135–145)
Total Bilirubin: 1.2 mg/dL (ref 0.3–1.2)
Total Protein: 8.9 g/dL — ABNORMAL HIGH (ref 6.5–8.1)

## 2018-08-17 LAB — ETHANOL: Alcohol, Ethyl (B): <10 mg/dL (ref <10)

## 2018-08-17 LAB — RAPID URINE DRUG SCREEN, HOSP PERFORMED
Amphetamines: NOT DETECTED
Barbiturates: NOT DETECTED
Benzodiazepines: NOT DETECTED
Cocaine: NOT DETECTED
Opiates: POSITIVE — AB
Tetrahydrocannabinol: POSITIVE — AB

## 2018-08-17 LAB — CBG MONITORING, ED: Glucose-Capillary: 174 mg/dL — ABNORMAL HIGH (ref 70–99)

## 2018-08-17 LAB — SARS CORONAVIRUS 2 BY RT PCR (HOSPITAL ORDER, PERFORMED IN ~~LOC~~ HOSPITAL LAB): SARS Coronavirus 2: NEGATIVE

## 2018-08-17 MED ORDER — LEVETIRACETAM IN NACL 1000 MG/100ML IV SOLN
1000.0000 mg | Freq: Once | INTRAVENOUS | Status: AC
  Administered 2018-08-17: 15:00:00 1000 mg via INTRAVENOUS
  Filled 2018-08-17: qty 100

## 2018-08-17 MED ORDER — LORAZEPAM 2 MG/ML IJ SOLN
INTRAMUSCULAR | Status: AC
  Administered 2018-08-17: 14:00:00 1 mg
  Filled 2018-08-17: qty 1

## 2018-08-17 MED ORDER — OXYCODONE-ACETAMINOPHEN 5-325 MG PO TABS: 1.0000 | ORAL_TABLET | ORAL | Status: DC | PRN

## 2018-08-17 MED ORDER — ONDANSETRON HCL 4 MG/2ML IJ SOLN
4.0000 mg | Freq: Once | INTRAMUSCULAR | Status: AC
  Administered 2018-08-17: 4 mg via INTRAVENOUS
  Filled 2018-08-17: qty 2

## 2018-08-17 MED ORDER — GADOBUTROL 1 MMOL/ML IV SOLN
6.0000 mL | Freq: Once | INTRAVENOUS | Status: AC | PRN
  Administered 2018-08-17: 21:00:00 6 mL via INTRAVENOUS

## 2018-08-17 MED ORDER — HYDROMORPHONE HCL 1 MG/ML IJ SOLN
1.0000 mg | Freq: Once | INTRAMUSCULAR | Status: AC
  Administered 2018-08-17: 10:00:00 1 mg via INTRAVENOUS
  Filled 2018-08-17: qty 1

## 2018-08-17 MED ORDER — PROPOFOL 10 MG/ML IV BOLUS
1.0000 mg/kg | Freq: Once | INTRAVENOUS | Status: AC
  Administered 2018-08-17: 18:00:00 60 mg via INTRAVENOUS
  Filled 2018-08-17: qty 20

## 2018-08-17 MED ORDER — LEVETIRACETAM 500 MG PO TABS
500.0000 mg | ORAL_TABLET | Freq: Two times a day (BID) | ORAL | Status: DC
  Administered 2018-08-17 – 2018-08-18 (×2): 500 mg via ORAL
  Filled 2018-08-17 (×2): qty 1

## 2018-08-17 MED ORDER — ONDANSETRON HCL 4 MG/2ML IJ SOLN
4.0000 mg | Freq: Four times a day (QID) | INTRAMUSCULAR | Status: DC | PRN
  Administered 2018-08-17: 4 mg via INTRAVENOUS
  Filled 2018-08-17: qty 2

## 2018-08-17 MED ORDER — HYDROMORPHONE HCL 1 MG/ML IJ SOLN
1.0000 mg | Freq: Once | INTRAMUSCULAR | Status: AC
  Administered 2018-08-17: 1 mg via INTRAVENOUS
  Filled 2018-08-17: qty 1

## 2018-08-17 MED ORDER — OXYCODONE-ACETAMINOPHEN 5-325 MG PO TABS
2.0000 | ORAL_TABLET | Freq: Once | ORAL | Status: AC
  Administered 2018-08-17: 22:00:00 2 via ORAL
  Filled 2018-08-17: qty 2

## 2018-08-17 NOTE — ED Notes (Signed)
EDP at bedside  

## 2018-08-17 NOTE — ED Notes (Signed)
ED TO INPATIENT HANDOFF REPORT  ED Nurse Name and Phone #:  Nasiyah Laverdiere [phone]  S Name/Age/Gender Jason Davenport 28 y.o. male Room/Bed: H022C/H022C  Code Status   Code Status: Not on file  Home/SNF/Other Home Patient oriented to: self, place, time and situation Is this baseline? Yes   Triage Complete: Triage complete  Chief Complaint Shoulder Dislocation  Triage Note Pt reports that left shoulder has popped out of joint again, hx of same multiple times.   Allergies No Known Allergies  Level of Care/Admitting Diagnosis ED Disposition    ED Disposition Condition Comment   Admit  [facility]ospital [facility] [[id number]]  Level of Care: Med-Surg [16]  I expect the patient will be discharged within 24 hours: Yes  LOW acuity---Tx typically complete <24 hrs---ACUTE conditions typically can be evaluated <24 hours---LABS likely to return to acceptable levels <24 hours---IS near functional baseline---EXPECTED to return to current living arrangement---NOT newly hypoxic: Meets criteria for 5C-Observation unit  Covid Evaluation: N/A  Diagnosis: Seizure (HCC) [205090]  Admitting Physician: Rometta EmeryGARBA, MOHAMMAD L [[id number]]  Attending Physician: Rometta EmeryGARBA, MOHAMMAD L [[id number]]  PT Class (Do Not Modify): Observation [104]  PT Acc Code (Do Not Modify): Observation [10022]       B Medical/Surgery History Past Medical History:  Diagnosis Date  . Seizure [facility](HCC)    History reviewed. No pertinent surgical history.   A IV Location/Drains/Wounds Patient Lines/Drains/Airways Status   Active Line/Drains/Airways    Name:   Placement date:   Placement time:   Site:   Days:   Peripheral IV 07/23/18 Right Arm   07/23/18    2012    Arm   25   Peripheral IV 08/17/18 Right Antecubital   08/17/18    1023    Antecubital   less than 1          Intake/Output Last 24 hours No intake or output data in the 24 hours ending 08/17/18 2028  Labs/Imaging Results for orders placed or performed during the  hospital encounter of 08/17/18 (from the past 48 hour(s))  POC CBG, ED     Status: Abnormal   Collection Time: 08/17/18  1:42 PM  Result Value Ref Range   Glucose-Capillary 174 (H) 70 - 99 mg/dL  Comprehensive metabolic panel     Status: Abnormal   Collection Time: 08/17/18  1:50 PM  Result Value Ref Range   Sodium 137 135 - 145 mmol/L   Potassium 3.5 3.5 - 5.1 mmol/L   Chloride 103 98 - 111 mmol/L   CO2 10 (L) 22 - 32 mmol/L   Glucose, Bld 217 (H) 70 - 99 mg/dL   BUN 7 6 - 20 mg/dL   Creatinine, Ser 9.601.45 (H) 0.61 - 1.24 mg/dL   Calcium 9.9 8.9 - 45.410.3 mg/dL   Total Protein 8.9 (H) 6.5 - 8.1 g/dL   Albumin 4.7 3.5 - 5.0 g/dL   AST 37 15 - 41 U/L   ALT 26 0 - 44 U/L   Alkaline Phosphatase 81 38 - 126 U/L   Total Bilirubin 1.2 0.3 - 1.2 mg/dL   GFR calc non Af Amer >60 >60 mL/min   GFR calc Af Amer >60 >60 mL/min   Anion gap 24 (H) 5 - 15    Comment: Performed at [facility], [address] [place], [place] [address]  CBC with Differential     Status: Abnormal   Collection Time: 08/17/18  1:50 PM  Result Value Ref Range  WBC 15.2 (H) 4.0 - 10.5 K/uL   RBC 5.80 4.22 - 5.81 MIL/uL   Hemoglobin 17.7 (H) 13.0 - 17.0 g/dL   HCT 16.1 (H) 09.6 - 04.5 %   MCV 95.7 80.0 - 100.0 fL   MCH 30.5 26.0 - 34.0 pg   MCHC 31.9 30.0 - 36.0 g/dL   RDW 40.9 81.1 - 91.4 %   Platelets 406 (H) 150 - 400 K/uL   nRBC 0.0 0.0 - 0.2 %   Neutrophils Relative % 75 %   Neutro Abs 11.5 (H) 1.7 - 7.7 K/uL   Lymphocytes Relative 14 %   Lymphs Abs 2.1 0.7 - 4.0 K/uL   Monocytes Relative 9 %   Monocytes Absolute 1.3 (H) 0.1 - 1.0 K/uL   Eosinophils Relative 0 %   Eosinophils Absolute 0.0 0.0 - 0.5 K/uL   Basophils Relative 0 %   Basophils Absolute 0.0 0.0 - 0.1 K/uL   Immature Granulocytes 2 %   Abs Immature Granulocytes 0.26 (H) 0.00 - 0.07 K/uL    Comment: Performed at [facility], [address], [place], [place] [address]  Ethanol     Status: None   Collection Time: 08/17/18  1:50  PM  Result Value Ref Range   Alcohol, Ethyl (B) <10 <10 mg/dL    Comment: (NOTE) Lowest detectable limit for serum alcohol is 10 mg/dL. For medical purposes only. Performed at [facility], [address], [place], [place] [address]   Urinalysis, Routine w reflex microscopic     Status: Abnormal   Collection Time: 08/17/18  5:00 PM  Result Value Ref Range   Color, Urine YELLOW YELLOW   APPearance CLOUDY (A) CLEAR   Specific Gravity, Urine 1.015 1.005 - 1.030   pH 5.0 5.0 - 8.0   Glucose, UA NEGATIVE NEGATIVE mg/dL   Hgb urine dipstick SMALL (A) NEGATIVE   Bilirubin Urine NEGATIVE NEGATIVE   Ketones, ur 20 (A) NEGATIVE mg/dL   Protein, ur 865 (A) NEGATIVE mg/dL   Nitrite NEGATIVE NEGATIVE   Leukocytes,Ua NEGATIVE NEGATIVE   RBC / HPF 0-5 0 - 5 RBC/hpf   WBC, UA 0-5 0 - 5 WBC/hpf   Bacteria, UA RARE (A) NONE SEEN   Mucus PRESENT    Hyaline Casts, UA PRESENT     Comment: Performed at [facility], [address], [place], [place] [address]  Rapid urine drug screen (hospital performed)     Status: Abnormal   Collection Time: 08/17/18  5:00 PM  Result Value Ref Range   Opiates POSITIVE (A) NONE DETECTED   Cocaine NONE DETECTED NONE DETECTED   Benzodiazepines NONE DETECTED NONE DETECTED   Amphetamines NONE DETECTED NONE DETECTED   Tetrahydrocannabinol POSITIVE (A) NONE DETECTED   Barbiturates NONE DETECTED NONE DETECTED    Comment: (NOTE) DRUG SCREEN FOR MEDICAL PURPOSES ONLY.  IF CONFIRMATION IS NEEDED FOR ANY PURPOSE, NOTIFY LAB WITHIN 5 DAYS. LOWEST DETECTABLE LIMITS FOR URINE DRUG SCREEN Drug Class                     Cutoff (ng/mL) Amphetamine and metabolites    1000 Barbiturate and metabolites    200 Benzodiazepine                 200 Tricyclics and metabolites     300 Opiates and metabolites        300 Cocaine and metabolites        300 THC  50 Performed at [facility], [address] [place], [place] [address]    SARS Coronavirus 2 (CEPHEID - Performed in [facility] hospital lab), Hosp Order     Status: None   Collection Time: 08/17/18  6:32 PM   Specimen: Nasopharyngeal Swab  Result Value Ref Range   SARS Coronavirus 2 NEGATIVE NEGATIVE    Comment: (NOTE) If result is NEGATIVE SARS-CoV-2 target nucleic acids are NOT DETECTED. The SARS-CoV-2 RNA is generally detectable in upper and lower  respiratory specimens during the acute phase of infection. The lowest  concentration of SARS-CoV-2 viral copies this assay can detect is 250  copies / mL. A negative result does not preclude SARS-CoV-2 infection  and should not be used as the sole basis for treatment or other  patient management decisions.  A negative result may occur with  improper specimen collection / handling, submission of specimen other  than nasopharyngeal swab, presence of viral mutation(s) within the  areas targeted by this assay, and inadequate number of viral copies  (<250 copies / mL). A negative result must be combined with clinical  observations, patient history, and epidemiological information. If result is POSITIVE SARS-CoV-2 target nucleic acids are DETECTED. The SARS-CoV-2 RNA is generally detectable in upper and lower  respiratory specimens dur ing the acute phase of infection.  Positive  results are indicative of active infection with SARS-CoV-2.  Clinical  correlation with patient history and other diagnostic information is  necessary to determine patient infection status.  Positive results do  not rule out bacterial infection or co-infection with other viruses. If result is PRESUMPTIVE POSTIVE SARS-CoV-2 nucleic acids MAY BE PRESENT.   A presumptive positive result was obtained on the submitted specimen  and confirmed on repeat testing.  While 2019 novel coronavirus  (SARS-CoV-2) nucleic acids may be present in the submitted sample  additional confirmatory testing may be necessary for epidemiological  and / or  clinical management purposes  to differentiate between  SARS-CoV-2 and other Sarbecovirus currently known to infect humans.  If clinically indicated additional testing with an alternate test  methodology [phone] is advised. The SARS-CoV-2 RNA is generally  detectable in upper and lower respiratory sp ecimens during the acute  phase of infection. The expected result is Negative. Fact Sheet for Patients:  BoilerBrush.com.cyhttps://www.fda.gov/media/136312/download Fact Sheet for Healthcare Providers: https://pope.com/https://www.fda.gov/media/136313/download This test is not yet approved or cleared by the [place] FDA and has been authorized for detection and/or diagnosis of SARS-CoV-2 by FDA under an Emergency Use Authorization (EUA).  This EUA will remain in effect (meaning this test can be used) for the duration of the COVID-19 declaration under Section 564(b)(1) of the Act, 21 U.S.C. section 360bbb-3(b)(1), unless the authorization is terminated or revoked sooner. Performed at [facility], [address] [place], [place] [address]    Ct Head Wo Contrast  Result Date: 08/17/2018 CLINICAL DATA:  Pain following fall with seizure EXAM: CT HEAD WITHOUT CONTRAST CT CERVICAL SPINE WITHOUT CONTRAST TECHNIQUE: Multidetector CT imaging of the head and cervical spine was performed following the standard protocol without intravenous contrast. Multiplanar CT image reconstructions of the cervical spine were also generated. COMPARISON:  CT head and CT cervical spine January 30, 2018 FINDINGS: CT HEAD FINDINGS Brain: Note that there is a degree of motion artifact. Ventricles appear normal in size and configuration. There is no evident intracranial mass, hemorrhage, extra-axial fluid collection, or midline shift. Brain parenchyma appears unremarkable. No acute infarct evident. Vascular: No hyperdense  vessel. No vascular lesions are demonstrable. Skull: Bony calvarium appears intact. There is a left frontal scalp hematoma.  Sinuses/Orbits: There is a retention cyst in the inferior left maxillary antrum. There is a small retention cyst in the right maxillary antrum more superiorly. Other paranasal sinuses which are visualized are clear. Orbits appear symmetric bilaterally. Other: Mastoid air cells are clear. There is debris in each external auditory canal. CT CERVICAL SPINE FINDINGS Alignment: There is no demonstrable spondylolisthesis. Skull base and vertebrae: Skull base and craniocervical junction regions appear normal. No evident fracture. No blastic or lytic bone lesions. Soft tissues and spinal canal: Prevertebral soft tissues and predental space regions are normal. No paraspinous lesions. No evident cord or canal hematoma. Disc levels: Disc spaces appear normal. No nerve root edema or effacement. No disc extrusion or stenosis. Upper chest: Visualized upper lung regions are clear. Other: None IMPRESSION: CT head: Mild degree of motion artifact. The brain parenchyma appears within normal limits. No mass or hemorrhage. Left frontal scalp hematoma.  No fracture evident. Mild paranasal sinus disease. Probable cerumen in each external auditory canal. CT cervical spine: No fracture or spondylolisthesis. No appreciable disc extrusion or stenosis. No appreciable arthropathy. Electronically Signed   By: Lowella Grip III M.D.   On: 08/17/2018 14:46   Ct Cervical Spine Wo Contrast  Result Date: 08/17/2018 CLINICAL DATA:  Pain following fall with seizure EXAM: CT HEAD WITHOUT CONTRAST CT CERVICAL SPINE WITHOUT CONTRAST TECHNIQUE: Multidetector CT imaging of the head and cervical spine was performed following the standard protocol without intravenous contrast. Multiplanar CT image reconstructions of the cervical spine were also generated. COMPARISON:  CT head and CT cervical spine January 30, 2018 FINDINGS: CT HEAD FINDINGS Brain: Note that there is a degree of motion artifact. Ventricles appear normal in size and configuration. There  is no evident intracranial mass, hemorrhage, extra-axial fluid collection, or midline shift. Brain parenchyma appears unremarkable. No acute infarct evident. Vascular: No hyperdense vessel. No vascular lesions are demonstrable. Skull: Bony calvarium appears intact. There is a left frontal scalp hematoma. Sinuses/Orbits: There is a retention cyst in the inferior left maxillary antrum. There is a small retention cyst in the right maxillary antrum more superiorly. Other paranasal sinuses which are visualized are clear. Orbits appear symmetric bilaterally. Other: Mastoid air cells are clear. There is debris in each external auditory canal. CT CERVICAL SPINE FINDINGS Alignment: There is no demonstrable spondylolisthesis. Skull base and vertebrae: Skull base and craniocervical junction regions appear normal. No evident fracture. No blastic or lytic bone lesions. Soft tissues and spinal canal: Prevertebral soft tissues and predental space regions are normal. No paraspinous lesions. No evident cord or canal hematoma. Disc levels: Disc spaces appear normal. No nerve root edema or effacement. No disc extrusion or stenosis. Upper chest: Visualized upper lung regions are clear. Other: None IMPRESSION: CT head: Mild degree of motion artifact. The brain parenchyma appears within normal limits. No mass or hemorrhage. Left frontal scalp hematoma.  No fracture evident. Mild paranasal sinus disease. Probable cerumen in each external auditory canal. CT cervical spine: No fracture or spondylolisthesis. No appreciable disc extrusion or stenosis. No appreciable arthropathy. Electronically Signed   By: Lowella Grip III M.D.   On: 08/17/2018 14:46   Dg Shoulder Left Portable  Result Date: 08/17/2018 CLINICAL DATA:  Status post reduction EXAM: LEFT SHOULDER - 1 VIEW COMPARISON:  Films from earlier in the same day. 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 The humeral head is now relocated in  the glenoid fossa. No acute  fracture is seen. IMPRESSION: Interval reduction of dislocation. Electronically Signed   By: Alcide CleverMark  Lukens M.D.   On: 08/17/2018 18:48   Dg Shoulder Left Portable  Result Date: 08/17/2018 CLINICAL DATA:  Concern for left shoulder dislocation today with multiple prior dislocations in the past 6 months. EXAM: LEFT SHOULDER - 1 VIEW COMPARISON:  07/23/2018 FINDINGS: There is a recurrent anterior dislocation of the humeral head relative to the glenoid. A chronic Hill-Sachs deformity is again noted with an adjacent 2 mm focus of bone or soft tissue calcification. No definite acute fracture is identified. The visualized portion of the left lung is clear. IMPRESSION: Recurrent anterior left glenohumeral dislocation. Electronically Signed   By: Sebastian AcheAllen  Grady M.D.   On: 08/17/2018 09:54    Pending Labs Unresulted Labs (From admission, onward)   None      Vitals/Pain Today's Vitals   08/17/18 1818 08/17/18 1830 08/17/18 1900 08/17/18 1930  BP: 108/84 125/89 137/80 136/87  Pulse: 86 81 84   Resp:      Temp:      TempSrc:      SpO2: 100% 96% 95%   Weight:      PainSc:        Isolation Precautions No active isolations  Medications Medications  levETIRAcetam (KEPPRA) tablet 500 mg (has no administration in time range)  HYDROmorphone (DILAUDID) injection 1 mg (1 mg Intravenous Given 08/17/18 1024)  ondansetron (ZOFRAN) injection 4 mg (4 mg Intravenous Given 08/17/18 1024)  HYDROmorphone (DILAUDID) injection 1 mg (1 mg Intravenous Given 08/17/18 1141)  propofol (DIPRIVAN) 10 mg/mL bolus/IV push 63.5 mg (60 mg Intravenous Given 08/17/18 1806)  LORazepam (ATIVAN) 2 MG/ML injection (1 mg  Given 08/17/18 1338)  levETIRAcetam (KEPPRA) IVPB 1000 mg/100 mL premix (0 mg Intravenous Stopped 08/17/18 1812)    Mobility walks High fall risk   Focused Assessments Neuro Assessment Handoff:  Swallow screen pass? Yes          Neuro Assessment:   Neuro Checks:      Last Documented NIHSS Modified Score:   Has  TPA been given? No If patient is a Neuro Trauma and patient is going to OR before floor call report to 4N Charge nurse: [phone] or [phone]     R Recommendations: See Admitting Provider Note  Report given to:   Additional Notes:

## 2018-08-17 NOTE — ED Provider Notes (Signed)
  Physical Exam  BP 108/84   Pulse 86   Temp 97.9 F (36.6 C) (Oral)   Resp 17   Wt 63.5 kg   SpO2 100%   BMI 19.53 kg/m   Physical Exam  ED Course/Procedures     Procedures  MDM  6:21 PM Patient care received from [facility] PA at shift change, please see her note for a full HPI. Briefly, patient with seizure disorder and left shoulder dislocation. Seen by neurology, recommended EEG, MRI brain with and without and admission for observation.  6:24 PM Shoulder reduction procedure performed by Dr. Vanita Panda with me at the bedside. Patient Covid 19 swab ordered, post reduction xray ordered. Will place for hospitalist admission.  6:31 PM spoke to Dr. Jeannie Fend hospitalist who will admit patient for observation along with MRI brain with and without which is pending currently.  Portions of this note were generated with Lobbyist. Dictation errors may occur despite best attempts at proofreading.       Janeece Fitting, PA-C 08/17/18 1832    Carmin Muskrat, MD 08/17/18 [phone]

## 2018-08-17 NOTE — ED Triage Notes (Signed)
Pt reports that left shoulder has popped out of joint again, hx of same multiple times.

## 2018-08-17 NOTE — ED Provider Notes (Signed)
[place] [facility] EMERGENCY DEPARTMENT Provider Note   CSN: [id number] Arrival date & time: 08/17/18  4818    History   Chief Complaint Chief Complaint  Patient presents with   Shoulder Pain    HPI Jason Davenport is a 28 y.o. male who presents to the ED for pain in his left shoulder that began 1 hour ago.  Pain is aching, worse with movement and palpation. States that he feels that he dislocated his shoulder.  States that this is the third time it has happened in the past 6 weeks.  He was told that he may ultimately need surgery but he has not yet followed up with orthopedics.  He states that he "slept on it wrong" and this caused a dislocation.  He denies any changes to sensation, injury or falls, swelling.     HPI  Past Medical History:  Diagnosis Date   Seizure ([place])     There are no active problems to display for this patient.   History reviewed. No pertinent surgical history.      Home Medications    Prior to Admission medications   Medication Sig Start Date End Date Taking? Authorizing Provider  HYDROcodone-acetaminophen (NORCO/VICODIN) 5-325 MG per tablet Take 2 tablets by mouth every 4 (four) hours as needed. Patient not taking: Reported on 01/30/2018 06/23/13   Teressa Lower, MD  ibuprofen (ADVIL,MOTRIN) 600 MG tablet Take 1 tablet (600 mg total) by mouth every 6 (six) hours as needed. Patient not taking: Reported on 08/17/2018 01/30/18   Volanda Napoleon, PA-C    Family History Family History  Problem Relation Age of Onset   Hypertension Mother    Hypertension Father     Social History Social History   Tobacco Use   Smoking status: Current Every Day Smoker   Smokeless tobacco: Never Used  Substance Use Topics   Alcohol use: No   Drug use: No     Allergies   Patient has no known allergies.   Review of Systems Review of Systems  Constitutional: Negative for appetite change, chills and fever.  HENT: Negative for ear pain,  rhinorrhea, sneezing and sore throat.   Eyes: Negative for photophobia and visual disturbance.  Respiratory: Negative for cough, chest tightness, shortness of breath and wheezing.   Cardiovascular: Negative for chest pain and palpitations.  Gastrointestinal: Negative for abdominal pain, blood in stool, constipation, diarrhea, nausea and vomiting.  Genitourinary: Negative for dysuria, hematuria and urgency.  Musculoskeletal: Positive for arthralgias. Negative for myalgias.  Skin: Negative for rash.  Neurological: Negative for dizziness, weakness and light-headedness.     Physical Exam Updated Vital Signs BP 116/74    Pulse 96    Temp 97.9 F (36.6 C) (Oral)    Resp 17    Wt 63.5 kg    SpO2 93%    BMI 19.53 kg/m   Physical Exam Vitals signs and nursing note reviewed.  Constitutional:      General: He is not in acute distress.    Appearance: He is well-developed.  HENT:     Head: Normocephalic and atraumatic.     Nose: Nose normal.  Eyes:     General: No scleral icterus.       Left eye: No discharge.     Conjunctiva/sclera: Conjunctivae normal.  Neck:     Musculoskeletal: Normal range of motion and neck supple.  Cardiovascular:     Rate and Rhythm: Normal rate and regular rhythm.     Heart sounds:  Normal heart sounds. No murmur. No friction rub. No gallop.   Pulmonary:     Effort: Pulmonary effort is normal. No respiratory distress.     Breath sounds: Normal breath sounds.  Abdominal:     General: Bowel sounds are normal. There is no distension.     Palpations: Abdomen is soft.     Tenderness: There is no abdominal tenderness. There is no guarding.  Musculoskeletal: Normal range of motion.        General: Tenderness and deformity present.     Comments: Palpable deformity of the left shoulder. Sensation intact to light touch of bilateral upper extremities.  2+ radial pulse palpated bilaterally.  Decreased range of motion secondary to pain and deformity.  Skin:    General:  Skin is warm and dry.     Findings: No rash.  Neurological:     Mental Status: He is alert.     Motor: No abnormal muscle tone.     Coordination: Coordination normal.      ED Treatments / Results  Labs (all labs ordered are listed, but only abnormal results are displayed) Labs Reviewed  COMPREHENSIVE METABOLIC PANEL - Abnormal; Notable for the following components:      Result Value   CO2 10 (*)    Glucose, Bld 217 (*)    Creatinine, Ser 1.45 (*)    Total Protein 8.9 (*)    Anion gap 24 (*)    All other components within normal limits  CBC WITH DIFFERENTIAL/PLATELET - Abnormal; Notable for the following components:   WBC 15.2 (*)    Hemoglobin 17.7 (*)    HCT 55.5 (*)    Platelets 406 (*)    Neutro Abs 11.5 (*)    Monocytes Absolute 1.3 (*)    Abs Immature Granulocytes 0.26 (*)    All other components within normal limits  CBG MONITORING, ED - Abnormal; Notable for the following components:   Glucose-Capillary 174 (*)    All other components within normal limits  ETHANOL  URINALYSIS, ROUTINE W REFLEX MICROSCOPIC  RAPID URINE DRUG SCREEN, HOSP PERFORMED    EKG EKG Interpretation  Date/Time:  Wednesday August 17 2018 13:42:34 EDT Ventricular Rate:  106 PR Interval:    QRS Duration: 101 QT Interval:  363 QTC Calculation: 482 R Axis:   85 Text Interpretation:  Sinus tachycardia Probable inferior infarct, old Probable lateral infarct, old When compard to prior, faster rate.  No STEMI Confirmed by Theda Belfast ([id number]) on 08/17/2018 2:46:30 PM   Radiology Ct Head Wo Contrast  Result Date: 08/17/2018 CLINICAL DATA:  Pain following fall with seizure EXAM: CT HEAD WITHOUT CONTRAST CT CERVICAL SPINE WITHOUT CONTRAST TECHNIQUE: Multidetector CT imaging of the head and cervical spine was performed following the standard protocol without intravenous contrast. Multiplanar CT image reconstructions of the cervical spine were also generated. COMPARISON:  CT head and CT cervical  spine January 30, 2018 FINDINGS: CT HEAD FINDINGS Brain: Note that there is a degree of motion artifact. Ventricles appear normal in size and configuration. There is no evident intracranial mass, hemorrhage, extra-axial fluid collection, or midline shift. Brain parenchyma appears unremarkable. No acute infarct evident. Vascular: No hyperdense vessel. No vascular lesions are demonstrable. Skull: Bony calvarium appears intact. There is a left frontal scalp hematoma. Sinuses/Orbits: There is a retention cyst in the inferior left maxillary antrum. There is a small retention cyst in the right maxillary antrum more superiorly. Other paranasal sinuses which are visualized are clear. Orbits appear symmetric  bilaterally. Other: Mastoid air cells are clear. There is debris in each external auditory canal. CT CERVICAL SPINE FINDINGS Alignment: There is no demonstrable spondylolisthesis. Skull base and vertebrae: Skull base and craniocervical junction regions appear normal. No evident fracture. No blastic or lytic bone lesions. Soft tissues and spinal canal: Prevertebral soft tissues and predental space regions are normal. No paraspinous lesions. No evident cord or canal hematoma. Disc levels: Disc spaces appear normal. No nerve root edema or effacement. No disc extrusion or stenosis. Upper chest: Visualized upper lung regions are clear. Other: None IMPRESSION: CT head: Mild degree of motion artifact. The brain parenchyma appears within normal limits. No mass or hemorrhage. Left frontal scalp hematoma.  No fracture evident. Mild paranasal sinus disease. Probable cerumen in each external auditory canal. CT cervical spine: No fracture or spondylolisthesis. No appreciable disc extrusion or stenosis. No appreciable arthropathy. Electronically Signed   By: Bretta BangWilliam  Woodruff III M.D.   On: 08/17/2018 14:46   Ct Cervical Spine Wo Contrast  Result Date: 08/17/2018 CLINICAL DATA:  Pain following fall with seizure EXAM: CT HEAD  WITHOUT CONTRAST CT CERVICAL SPINE WITHOUT CONTRAST TECHNIQUE: Multidetector CT imaging of the head and cervical spine was performed following the standard protocol without intravenous contrast. Multiplanar CT image reconstructions of the cervical spine were also generated. COMPARISON:  CT head and CT cervical spine January 30, 2018 FINDINGS: CT HEAD FINDINGS Brain: Note that there is a degree of motion artifact. Ventricles appear normal in size and configuration. There is no evident intracranial mass, hemorrhage, extra-axial fluid collection, or midline shift. Brain parenchyma appears unremarkable. No acute infarct evident. Vascular: No hyperdense vessel. No vascular lesions are demonstrable. Skull: Bony calvarium appears intact. There is a left frontal scalp hematoma. Sinuses/Orbits: There is a retention cyst in the inferior left maxillary antrum. There is a small retention cyst in the right maxillary antrum more superiorly. Other paranasal sinuses which are visualized are clear. Orbits appear symmetric bilaterally. Other: Mastoid air cells are clear. There is debris in each external auditory canal. CT CERVICAL SPINE FINDINGS Alignment: There is no demonstrable spondylolisthesis. Skull base and vertebrae: Skull base and craniocervical junction regions appear normal. No evident fracture. No blastic or lytic bone lesions. Soft tissues and spinal canal: Prevertebral soft tissues and predental space regions are normal. No paraspinous lesions. No evident cord or canal hematoma. Disc levels: Disc spaces appear normal. No nerve root edema or effacement. No disc extrusion or stenosis. Upper chest: Visualized upper lung regions are clear. Other: None IMPRESSION: CT head: Mild degree of motion artifact. The brain parenchyma appears within normal limits. No mass or hemorrhage. Left frontal scalp hematoma.  No fracture evident. Mild paranasal sinus disease. Probable cerumen in each external auditory canal. CT cervical spine:  No fracture or spondylolisthesis. No appreciable disc extrusion or stenosis. No appreciable arthropathy. Electronically Signed   By: Bretta BangWilliam  Woodruff III M.D.   On: 08/17/2018 14:46   Dg Shoulder Left Portable  Result Date: 08/17/2018 CLINICAL DATA:  Concern for left shoulder dislocation today with multiple prior dislocations in the past 6 months. EXAM: LEFT SHOULDER - 1 VIEW COMPARISON:  07/23/2018 FINDINGS: There is a recurrent anterior dislocation of the humeral head relative to the glenoid. A chronic Hill-Sachs deformity is again noted with an adjacent 2 mm focus of bone or soft tissue calcification. No definite acute fracture is identified. The visualized portion of the left lung is clear. IMPRESSION: Recurrent anterior left glenohumeral dislocation. Electronically Signed   By: Freida BusmanAllen  Mosetta PuttGrady M.D.   On: 08/17/2018 09:54    Procedures .Critical Care Performed by: Dietrich PatesKhatri, Coby Shrewsberry, PA-C Authorized by: Dietrich PatesKhatri, Cadee Agro, PA-C   Critical care provider statement:    Critical care time (minutes):  35   Critical care was necessary to treat or prevent imminent or life-threatening deterioration of the following conditions:  CNS failure or compromise and trauma   Critical care was time spent personally by me on the following activities:  Discussions with consultants, evaluation of patient's response to treatment, examination of patient, ordering and review of laboratory studies, ordering and review of radiographic studies, re-evaluation of patient's condition, review of old charts and ordering and performing treatments and interventions   (including critical care time)  Medications Ordered in ED Medications  propofol (DIPRIVAN) 10 mg/mL bolus/IV push 63.5 mg (has no administration in time range)  levETIRAcetam (KEPPRA) tablet 500 mg (has no administration in time range)  HYDROmorphone (DILAUDID) injection 1 mg (1 mg Intravenous Given 08/17/18 1024)  ondansetron (ZOFRAN) injection 4 mg (4 mg Intravenous Given  08/17/18 1024)  HYDROmorphone (DILAUDID) injection 1 mg (1 mg Intravenous Given 08/17/18 1141)  LORazepam (ATIVAN) 2 MG/ML injection (1 mg  Given 08/17/18 1338)  levETIRAcetam (KEPPRA) IVPB 1000 mg/100 mL premix (1,000 mg Intravenous New Bag/Given 08/17/18 1514)     Initial Impression / Assessment and Plan / ED Course  I have reviewed the triage vital signs and the nursing notes.  Pertinent labs & imaging results that were available during my care of the patient were reviewed by me and considered in my medical decision making (see chart for details).        [age] year old male with a past medical history of 1 seizure in the past, recurrent L shoulder dislocation, presents to ED for possible shoulder dislocation.  States that he slept in a position that caused his shoulder to be dislocated when he woke up this morning.  He has not yet followed up with the orthopedist regarding possible surgery.  States that the first time this happened to him, he suffered a seizure and then had the dislocation.  He was not sure any antiepileptic medication.  States that he has not seized since then.  Deformity noted on exam of the left shoulder.  Limited range of motion secondary to pain.  Pulses are intact distally.  Will obtain x-ray of the shoulder and reassess.  10:35 AM X-ray shows anterior dislocation.  Patient was given pain medication, we have attempted to reduce the joint but unsuccessfully.  Will plan for sedation.  12:56 PM Patient with seizure at bedside.  He did fall to the floor and sustained a head injury.  Patient given Ativan, will order lab work and imaging.  3:36 PM CT of the head is unremarkable.  CT cervical spine unremarkable.  CBG is 174.  Leukocytosis of 15 which could be reactive from his seizure.  Creatinine elevated to 1.45.  Ethanol level is unremarkable.  Urinalysis and UDS pending.  No cause of seizure at this point.  I spoke to neurology, Onalee HuaDavid who evaluated the patient recommends MRI, EEG  and admission to the hospital.  Recommends reduction of shoulder with Versed or propofol after EEG is done. Patient loaded with Keppra. Question alcohol withdrawal seizure as he notes last alcoholic beverage was 2 days ago, but denies drinking heavily. [facility] pending remainder of workup. Patient will need reduction of dislocation.  Final Clinical Impressions(s) / ED Diagnoses   Final diagnoses:  Seizure ([facility])  Dislocation of left shoulder joint, initial encounter    ED Discharge Orders    None       Dietrich PatesKhatri, Alfrieda Tarry, PA-C 08/17/18 1547    Tegeler, Canary Brimhristopher J, MD 08/17/18 [phone]

## 2018-08-17 NOTE — ED Provider Notes (Signed)
.  Ortho Injury Treatment  Date/Time: 08/17/2018 6:21 PM Performed by: Carmin Muskrat, MD Authorized by: Carmin Muskrat, MD   Consent:    Consent obtained:  Verbal   Consent given by:  Patient   Risks discussed:  Irreducible dislocation   Alternatives discussed:  No treatment and alternative treatment Universal protocol:    Procedure explained and questions answered to patient or proxy's satisfaction: yes     Relevant documents present and verified: yes     Test results available and properly labeled: yes     Imaging studies available: yes     Required blood products, implants, devices, and special equipment available: yes     Site/side marked: yes     Immediately prior to procedure a time out was called: yes     Patient identity confirmed:  Verbally with patientInjury location: shoulder Location details: left shoulder Injury type: dislocation Dislocation type: anterior Hill-Sachs deformity: no Chronicity: recurrent Pre-procedure neurovascular assessment: neurovascularly intact Pre-procedure distal perfusion: normal Pre-procedure neurological function: normal Pre-procedure range of motion: reduced  Anesthesia: Local anesthesia used: no  Patient sedated: Yes. Refer to sedation procedure documentation for details of sedation. Manipulation performed: yes Reduction method: traction and counter traction and scapular manipulation Reduction successful: yes X-ray confirmed reduction: yes Immobilization: sling Post-procedure neurovascular assessment: post-procedure neurovascularly intact Post-procedure distal perfusion: normal Post-procedure neurological function: normal Post-procedure range of motion: improved Patient tolerance: patient tolerated the procedure well with no immediate complications      Carmin Muskrat, MD 08/17/18 Vernelle Emerald

## 2018-08-17 NOTE — H&P (Signed)
History and Physical   Jason HoyleMontez Endo ZOX:[id number] DOB: 13-Apr-1990 DOA: 08/17/2018  Referring MD/NP/PA: Dr Jeraldine LootsLockwood  PCP: Patient, No Pcp Per   Outpatient Specialists: None  Patient coming from: Home  Chief Complaint: Seizure left shoulder pain  HPI: Jason Davenport is a 28 y.o. male with medical history significant of no significant past medical history except for an episode of seizure many months ago who presented to the ER after suspected seizure overnight.  He was found to have dislocated left shoulder which is his presenting complaint.  Patient initially thought he slept wrongly on the left shoulder.  He came to the ER where he was seen and evaluated.  He denied any fall.  Patient has visible forehead hematoma indicating possible fall.  He has severe pain and not able to use his left shoulder.  Work-up was initiated in the ER including EEG.  MRI of the brain has been ordered.  His left shoulder has been relocated.  He had one episode of seizure in the ER where he hit his forehead indicating that patient has had recurrent seizure.  He has received Ativan and loaded with Keppra.  Neurology was consulted and recommended observation and continue with Keppra.  Patient reported not able to eat or drink for days.  ED Course: Temperature is 97.9 blood pressure 142/69 pulse 96 respirate of 18 oxygen sats 93% on room air.  White count is 15.2 hemoglobin 17.7 and platelets 406.  Sodium 137 potassium 3.5 chloride 103 CO2 of 10 BUN 7 creatinine 1.45 and calcium 9.9.  Glucose is 217.  Urinalysis essentially negative.  Urine drug screen is positive for opiates and THC.  CT cervical spine and CT head is negative.  X-ray of the left shoulder showed initial dislocation with interval reduction.  Review of Systems: As per HPI otherwise 10 point review of systems negative.    Past Medical History:  Diagnosis Date   Seizure [facility](HCC)     History reviewed. No pertinent surgical history.   reports that he has been  smoking. He has never used smokeless tobacco. He reports that he does not drink alcohol or use drugs.  No Known Allergies  Family History  Problem Relation Age of Onset   Hypertension Mother    Hypertension Father      Prior to Admission medications   Medication Sig Start Date End Date Taking? Authorizing Provider  HYDROcodone-acetaminophen (NORCO/VICODIN) 5-325 MG per tablet Take 2 tablets by mouth every 4 (four) hours as needed. Patient not taking: Reported on 01/30/2018 06/23/13   Sunnie Nielsenpitz, Brian, MD  ibuprofen (ADVIL,MOTRIN) 600 MG tablet Take 1 tablet (600 mg total) by mouth every 6 (six) hours as needed. Patient not taking: Reported on 08/17/2018 01/30/18   Maxwell CaulLayden, Lindsey A, PA-C    Physical Exam: Vitals:   08/17/18 1813 08/17/18 1815 08/17/18 1818 08/17/18 1830  BP: 127/90 116/69 108/84 125/89  Pulse: 85 83 86 81  Resp:      Temp:      TempSrc:      SpO2: 100% 100% 100% 96%  Weight:          Constitutional: NAD, anxious, left forehead induration Vitals:   08/17/18 1813 08/17/18 1815 08/17/18 1818 08/17/18 1830  BP: 127/90 116/69 108/84 125/89  Pulse: 85 83 86 81  Resp:      Temp:      TempSrc:      SpO2: 100% 100% 100% 96%  Weight:       Eyes: PERRL, lids and  conjunctivae normal ENMT: Mucous membranes are moist. Posterior pharynx clear of any exudate or lesions.Normal dentition.  Neck: normal, supple, no masses, no thyromegaly Respiratory: clear to auscultation bilaterally, no wheezing, no crackles. Normal respiratory effort. No accessory muscle use.  Cardiovascular: Sinus tachycardia no murmurs / rubs / gallops. No extremity edema. 2+ pedal pulses. No carotid bruits.  Abdomen: no tenderness, no masses palpated. No hepatosplenomegaly. Bowel sounds positive.  Musculoskeletal: no clubbing / cyanosis. No joint deformity upper and lower extremities. Good ROM, no contractures. Normal muscle tone.  Skin: no rashes, lesions, ulcers. No induration.  Left forehead  induration Neurologic: CN 2-12 grossly intact. Sensation intact, DTR normal. Strength 5/5 in all 4.  Psychiatric: Normal judgment and insight. Alert and oriented x 3. Normal mood.     Labs on Admission: I have personally reviewed following labs and imaging studies  CBC: Recent Labs  Lab 08/17/18 1350  WBC 15.2*  NEUTROABS 11.5*  HGB 17.7*  HCT 55.5*  MCV 95.7  PLT 406*   Basic Metabolic Panel: Recent Labs  Lab 08/17/18 1350  NA 137  K 3.5  CL 103  CO2 10*  GLUCOSE 217*  BUN 7  CREATININE 1.45*  CALCIUM 9.9   GFR: Estimated Creatinine Clearance: 69.3 mL/min (A) (by C-G formula based on SCr of 1.45 mg/dL (H)). Liver Function Tests: Recent Labs  Lab 08/17/18 1350  AST 37  ALT 26  ALKPHOS 81  BILITOT 1.2  PROT 8.9*  ALBUMIN 4.7   No results for input(s): LIPASE, AMYLASE in the last 168 hours. No results for input(s): AMMONIA in the last 168 hours. Coagulation Profile: No results for input(s): INR, PROTIME in the last 168 hours. Cardiac Enzymes: No results for input(s): CKTOTAL, CKMB, CKMBINDEX, TROPONINI in the last 168 hours. BNP (last 3 results) No results for input(s): PROBNP in the last 8760 hours. HbA1C: No results for input(s): HGBA1C in the last 72 hours. CBG: Recent Labs  Lab 08/17/18 1342  GLUCAP 174*   Lipid Profile: No results for input(s): CHOL, HDL, LDLCALC, TRIG, CHOLHDL, LDLDIRECT in the last 72 hours. Thyroid Function Tests: No results for input(s): TSH, T4TOTAL, FREET4, T3FREE, THYROIDAB in the last 72 hours. Anemia Panel: No results for input(s): VITAMINB12, FOLATE, FERRITIN, TIBC, IRON, RETICCTPCT in the last 72 hours. Urine analysis:    Component Value Date/Time   COLORURINE YELLOW 08/17/2018 1700   APPEARANCEUR CLOUDY (A) 08/17/2018 1700   LABSPEC 1.015 08/17/2018 1700   PHURINE 5.0 08/17/2018 1700   GLUCOSEU NEGATIVE 08/17/2018 1700   HGBUR SMALL (A) 08/17/2018 1700   BILIRUBINUR NEGATIVE 08/17/2018 1700   KETONESUR 20 (A)  08/17/2018 1700   PROTEINUR 100 (A) 08/17/2018 1700   UROBILINOGEN 0.2 11/15/2010 2356   NITRITE NEGATIVE 08/17/2018 1700   LEUKOCYTESUR NEGATIVE 08/17/2018 1700   Sepsis Labs: @LABRCNTIP (procalcitonin:4,lacticidven:4) )No results found for this or any previous visit (from the past 240 hour(s)).   Radiological Exams on Admission: Ct Head Wo Contrast  Result Date: 08/17/2018 CLINICAL DATA:  Pain following fall with seizure EXAM: CT HEAD WITHOUT CONTRAST CT CERVICAL SPINE WITHOUT CONTRAST TECHNIQUE: Multidetector CT imaging of the head and cervical spine was performed following the standard protocol without intravenous contrast. Multiplanar CT image reconstructions of the cervical spine were also generated. COMPARISON:  CT head and CT cervical spine January 30, 2018 FINDINGS: CT HEAD FINDINGS Brain: Note that there is a degree of motion artifact. Ventricles appear normal in size and configuration. There is no evident intracranial mass, hemorrhage, extra-axial fluid  collection, or midline shift. Brain parenchyma appears unremarkable. No acute infarct evident. Vascular: No hyperdense vessel. No vascular lesions are demonstrable. Skull: Bony calvarium appears intact. There is a left frontal scalp hematoma. Sinuses/Orbits: There is a retention cyst in the inferior left maxillary antrum. There is a small retention cyst in the right maxillary antrum more superiorly. Other paranasal sinuses which are visualized are clear. Orbits appear symmetric bilaterally. Other: Mastoid air cells are clear. There is debris in each external auditory canal. CT CERVICAL SPINE FINDINGS Alignment: There is no demonstrable spondylolisthesis. Skull base and vertebrae: Skull base and craniocervical junction regions appear normal. No evident fracture. No blastic or lytic bone lesions. Soft tissues and spinal canal: Prevertebral soft tissues and predental space regions are normal. No paraspinous lesions. No evident cord or canal  hematoma. Disc levels: Disc spaces appear normal. No nerve root edema or effacement. No disc extrusion or stenosis. Upper chest: Visualized upper lung regions are clear. Other: None IMPRESSION: CT head: Mild degree of motion artifact. The brain parenchyma appears within normal limits. No mass or hemorrhage. Left frontal scalp hematoma.  No fracture evident. Mild paranasal sinus disease. Probable cerumen in each external auditory canal. CT cervical spine: No fracture or spondylolisthesis. No appreciable disc extrusion or stenosis. No appreciable arthropathy. Electronically Signed   By: Lowella Grip III M.D.   On: 08/17/2018 14:46   Ct Cervical Spine Wo Contrast  Result Date: 08/17/2018 CLINICAL DATA:  Pain following fall with seizure EXAM: CT HEAD WITHOUT CONTRAST CT CERVICAL SPINE WITHOUT CONTRAST TECHNIQUE: Multidetector CT imaging of the head and cervical spine was performed following the standard protocol without intravenous contrast. Multiplanar CT image reconstructions of the cervical spine were also generated. COMPARISON:  CT head and CT cervical spine January 30, 2018 FINDINGS: CT HEAD FINDINGS Brain: Note that there is a degree of motion artifact. Ventricles appear normal in size and configuration. There is no evident intracranial mass, hemorrhage, extra-axial fluid collection, or midline shift. Brain parenchyma appears unremarkable. No acute infarct evident. Vascular: No hyperdense vessel. No vascular lesions are demonstrable. Skull: Bony calvarium appears intact. There is a left frontal scalp hematoma. Sinuses/Orbits: There is a retention cyst in the inferior left maxillary antrum. There is a small retention cyst in the right maxillary antrum more superiorly. Other paranasal sinuses which are visualized are clear. Orbits appear symmetric bilaterally. Other: Mastoid air cells are clear. There is debris in each external auditory canal. CT CERVICAL SPINE FINDINGS Alignment: There is no demonstrable  spondylolisthesis. Skull base and vertebrae: Skull base and craniocervical junction regions appear normal. No evident fracture. No blastic or lytic bone lesions. Soft tissues and spinal canal: Prevertebral soft tissues and predental space regions are normal. No paraspinous lesions. No evident cord or canal hematoma. Disc levels: Disc spaces appear normal. No nerve root edema or effacement. No disc extrusion or stenosis. Upper chest: Visualized upper lung regions are clear. Other: None IMPRESSION: CT head: Mild degree of motion artifact. The brain parenchyma appears within normal limits. No mass or hemorrhage. Left frontal scalp hematoma.  No fracture evident. Mild paranasal sinus disease. Probable cerumen in each external auditory canal. CT cervical spine: No fracture or spondylolisthesis. No appreciable disc extrusion or stenosis. No appreciable arthropathy. Electronically Signed   By: Lowella Grip III M.D.   On: 08/17/2018 14:46   Dg Shoulder Left Portable  Result Date: 08/17/2018 CLINICAL DATA:  Concern for left shoulder dislocation today with multiple prior dislocations in the past 6 months. EXAM: LEFT  SHOULDER - 1 VIEW COMPARISON:  07/23/2018 FINDINGS: There is a recurrent anterior dislocation of the humeral head relative to the glenoid. A chronic Hill-Sachs deformity is again noted with an adjacent 2 mm focus of bone or soft tissue calcification. No definite acute fracture is identified. The visualized portion of the left lung is clear. IMPRESSION: Recurrent anterior left glenohumeral dislocation. Electronically Signed   By: Sebastian AcheAllen  Grady M.D.   On: 08/17/2018 09:54    EKG: Independently reviewed.  Shows sinus tachycardia with a rate of 106.  Evidence of LVH.  Flipped T waves in the lateral lead with no significant ST depression. Appears similar to EKG in December 2019.  Assessment/Plan Principal Problem:   Seizure [facility](HCC) Active Problems:   Anterior shoulder dislocation, left, initial  encounter     #1 seizure disorder: Patient reportedly had seizure episode months ago.  It appears he has recurrent seizures at least twice in the ER.  Patient loaded with Keppra.  MRI is pending EEG has been done.  Neurology consulted.  Patient aware he cannot drive anymore.  [place] girlfriend will drive him back.  We will await MRI overnight and get neurology recommendations prior to discharge.  #2 anterior shoulder dislocation: Status post reduction.  No pain at this point.  Continue conservative measures.  #3 polysubstance abuse: Patient is positive for opiates as well as THC.  Not on any prescription.  Counseling provided.   DVT prophylaxis: SCD Code Status: Full code Family Communication: No family bedside but his sister is available over the phone Disposition Plan: Home Consults called: None Admission status: Observation  Severity of Illness: The appropriate patient status for this patient is OBSERVATION. Observation status is judged to be reasonable and necessary in order to provide the required intensity of service to ensure the patient's safety. The patient's presenting symptoms, physical exam findings, and initial radiographic and laboratory data in the context of their medical condition is felt to place them at decreased risk for further clinical deterioration. Furthermore, it is anticipated that the patient will be medically stable for discharge from the hospital within 2 midnights of admission. The following factors support the patient status of observation.   " The patient's presenting symptoms include left shoulder pain and seizure. " The physical exam findings include left forearm induration. " The initial radiographic and laboratory data are dislocated left shoulder.     Lonia BloodGARBA,LAWAL MD [facility] Pager [phone] leukocytosis  If 7PM-7AM, please contact night-coverage [email or web address] Password [email or web address]  08/17/2018, 6:32 PM

## 2018-08-17 NOTE — Procedures (Signed)
ELECTROENCEPHALOGRAM REPORT   Patient: Jason Davenport       Room #: 032C EEG No. ID: [id number] Age: 28 y.o.        Sex: male Referring Physician: Vanita Panda Report Date:  08/17/2018        Interpreting Physician: Alexis Goodell  History: Jason Davenport is an 28 y.o. male with a dislocated shoulder  Medications:  None  Conditions of Recording:  This is a 21 channel routine scalp EEG performed with bipolar and monopolar montages arranged in accordance to the international 10/20 system of electrode placement. One channel was dedicated to EKG recording.  The patient is in the awake state.  Description:  Artifact is prominent during the recording often obscuring the background rhythm. When able to be visualized the waking background activity consists of a low voltage, symmetrical, fairly well organized, 10 Hz alpha activity, seen from the parieto-occipital and posterior temporal regions.  Low voltage fast activity, poorly organized, is seen anteriorly and is at times superimposed on more posterior regions.  A mixture of theta and alpha rhythms are seen from the central and temporal regions. The patient has episodes of shivering and twitches.  Although artifact is prominent during these episodes a posterior background rhythm can be seen.  No epileptiform activity is noted.  The patient does not drowse or sleep. Hyperventilation and intermittent photic stimulation were not performed.  IMPRESSION: This is a normal awake electroencephalogram.  The patient has episodes of shivering/twitches and although artifact is prominent during these episodes, a posterior background rhythm can be seen.  No epileptiform activity is noted.    Alexis Goodell, MD Neurology [phone] 08/17/2018, 5:24 PM

## 2018-08-17 NOTE — Consult Note (Addendum)
Neurology Consultation  Reason for Consult: Seizure  Referring Physician: Dr. Rush Landmarkegeler  CC: Seizure  History is obtained from: Patient  HPI: Jason Davenport is a 28 y.o. male with no history other than new diagnosis of seizure.  Per patient he had a seizure back in December but does not really recall the seizure.  The patient states that he woke up this morning and had dislocated his left shoulder which is why he came to the hospital.  He feels as though he fell out of bed dislocating it but does not recall how and why he fell out of his bed. While he was in the ED he had a witnessed seizure.  Patient denies taking any benzodiazepines, he does admit to drinking alcohol but states he does not drink heavily and his last drink was approximately 2 days ago.  States he was a normal birth, no head trauma in the past, no history of seizures in his family.  Currently he is very groggy from receiving Ativan.  He reports that his girlfriend witnessed him having a seizure in December. He never had a seizure before that. Counting the seizure in the ED today, he has had a total of 2 witnessed seizures in his lifetime. The episode during which he dislocated his shoulder is acknowledged as possibly having been due to a seizure, but he has no memory of the event itself.   ED course: Received Ativan for seizure. In the ED, he had a seizure and did fall out of the bed and hit his head.   CT head: Mild degree of motion artifact. The brain parenchyma appears within normal limits. No mass or hemorrhage. Left frontal scalp hematoma.  No fracture evident. Mild paranasal sinus disease. Probable cerumen in each external auditory canal. (images personally reviewed)  CT cervical spine: No fracture or spondylolisthesis. No appreciable disc extrusion or stenosis. No appreciable arthropathy.   ROS: A 14 point ROS was performed and is negative except as noted in the HPI.  Past Medical History:  Diagnosis Date  . Seizure  [facility](HCC)     Family History  Problem Relation Age of Onset  . Hypertension Mother   . Hypertension Father     Social History:   reports that he has been smoking. He has never used smokeless tobacco. He reports that he does not drink alcohol or use drugs.  Medications  Current Facility-Administered Medications:  .  levETIRAcetam (KEPPRA) IVPB 1000 mg/100 mL premix, 1,000 mg, Intravenous, Once, Khatri, Hina, PA-C .  propofol (DIPRIVAN) 10 mg/mL bolus/IV push 1 mg/kg, 1 mg/kg, Intravenous, Once, Khatri, Hina, PA-C  Current Outpatient Medications:  .  HYDROcodone-acetaminophen (NORCO/VICODIN) 5-325 MG per tablet, Take 2 tablets by mouth every 4 (four) hours as needed. (Patient not taking: Reported on 01/30/2018), Disp: 10 tablet, Rfl: 0 .  ibuprofen (ADVIL,MOTRIN) 600 MG tablet, Take 1 tablet (600 mg total) by mouth every 6 (six) hours as needed. (Patient not taking: Reported on 08/17/2018), Disp: 15 tablet, Rfl: 0   Exam: Current vital signs: BP 116/74   Pulse 96   Temp 97.9 F (36.6 C) (Oral)   Resp 18   SpO2 93%  Vital signs in last 24 hours: Temp:  [97.6 F (36.4 C)-97.9 F (36.6 C)] 97.9 F (36.6 C) (07/01 1349) Pulse Rate:  [53-96] 96 (07/01 1349) Resp:  [14-18] 18 (07/01 1400) BP: (116-142)/(69-95) 116/74 (07/01 1400) SpO2:  [93 %-100 %] 93 % (07/01 1349)  Physical Exam  Constitutional: Appears well-developed and well-nourished.  Psych: Affect appropriate to situation Eyes: No scleral injection HENT: Hematoma on left frontal aspect of head, engorged lip on the left from fall Head: Normocephalic.  Cardiovascular: Normal rate and regular rhythm.  Respiratory: Effort normal, non-labored breathing GI: Soft.  No distension. There is no tenderness.  Skin: WDI  Neuro: Mental Status: Patient is awake, alert, oriented to person, place, month, year, and situation. Patient is able to give a clear and coherent history. No signs of aphasia or neglect Cranial Nerves: II:  Visual Fields are full.  III,IV, VI: EOMI without ptosis or diploplia. Pupils equal, round and reactive to light V: Facial sensation is symmetric to temperature VII: Facial movement is symmetric.  VIII: hearing is intact to voice X: Palat elevates symmetrically XI: Shoulder shrug is symmetric. XII: tongue is midline without atrophy or fasciculations.  Motor: Tone is normal. Bulk is normal. 5/5 strength in right upper extremity and bilateral legs.  Patient has difficulty moving his left arm secondary to dislocating his shoulder however he does have a good grip but I cannot assess his bicep, tricep, shoulder abduction secondary to severe pain Sensory: Sensation is symmetric to light touch and temperature in the arms and legs. Deep Tendon Reflexes: 2+ and symmetric in the biceps and patellae.  Plantars: Toes are downgoing bilaterally.  Cerebellar: FNF on the right and HKS are intact bilaterally  Labs I have reviewed labs in epic and the results pertinent to this consultation are:   CBC    Component Value Date/Time   WBC 15.2 (H) 08/17/2018 1350   RBC 5.80 08/17/2018 1350   HGB 17.7 (H) 08/17/2018 1350   HCT 55.5 (H) 08/17/2018 1350   PLT 406 (H) 08/17/2018 1350   MCV 95.7 08/17/2018 1350   MCH 30.5 08/17/2018 1350   MCHC 31.9 08/17/2018 1350   RDW 13.4 08/17/2018 1350   LYMPHSABS 2.1 08/17/2018 1350   MONOABS 1.3 (H) 08/17/2018 1350   EOSABS 0.0 08/17/2018 1350   BASOSABS 0.0 08/17/2018 1350    CMP     Component Value Date/Time   NA 136 01/30/2018 0953   K 4.9 01/30/2018 0953   CL 100 01/30/2018 0953   CO2 21 (L) 01/30/2018 0953   GLUCOSE 222 (H) 01/30/2018 0953   BUN 11 01/30/2018 0953   CREATININE 1.19 01/30/2018 0953   CALCIUM 9.9 01/30/2018 0953   PROT 8.7 (H) 01/30/2018 0953   ALBUMIN 4.4 01/30/2018 0953   AST 35 01/30/2018 0953   ALT 24 01/30/2018 0953   ALKPHOS 65 01/30/2018 0953   BILITOT 0.9 01/30/2018 0953   GFRNONAA >60 01/30/2018 0953   GFRAA >60  01/30/2018 0953    Lipid Panel  No results found for: CHOL, TRIG, HDL, CHOLHDL, VLDL, LDLCALC, LDLDIRECT   Felicie MornDavid Smith PA-C [facility] [phone] 08/17/2018, 3:03 PM     Assessment:  28 year old male with first time seizure in December 2019, now presenting with a shoulder dislocation possibly due to a second seizure, with definite witnessed seizure activity in the ED subsequently.  1. The seizures do not appear to have been provoked. He denies withdrawing from EtOH or benzodiazepines.    2. At this point time given the multiple seizures he will need to be placed on an antiepileptic medication.   3. He has been loaded with 1000 mg of Keppra and will need to continue antiepileptic medication   Recommendations: - EEG - MRI of head - Seizure precautions - Keppra 500 mg twice daily - Follow-up with outpatient  neurologist -Per [facility] statutes, patients with seizures are not allowed to drive until  they have been seizure-free for six months. Use caution when using heavy equipment or power tools. Avoid working on ladders or at heights. Take showers instead of baths. Ensure the water temperature is not too high on the home water heater. Do not go swimming alone. When caring for infants or small children, sit down when holding, feeding, or changing them to minimize risk of injury to the child in the event you have a seizure. Also, Maintain good sleep hygiene. Avoid alcohol.   I have interviewed and examined the patient. 28 year old male with seizure recurrence after an initial seizure in December. CT head is unremarkable. Obtaining MRI brain and EEG. Has been loaded with Keppra and will need to continue at 500 mg BID.  Electronically signed: Dr. Kerney Elbe

## 2018-08-17 NOTE — Progress Notes (Signed)
EEG complete - results pending 

## 2018-08-17 NOTE — ED Notes (Signed)
EEG at bedside.

## 2018-08-18 MED ORDER — LEVETIRACETAM 500 MG PO TABS: 500.0000 mg | ORAL_TABLET | Freq: Two times a day (BID) | ORAL | 2 refills | Status: DC

## 2018-08-18 MED FILL — levETIRAcetam 500 MG TABS: 500 | 30 days supply | Qty: 60 | Fill #0

## 2018-08-18 NOTE — Progress Notes (Signed)
Subjective: [name] reports that he is doing well today, he reports that he is a little tired today. He denies any confusion, headaches, or light headedness today.   Objective: Current vital signs: BP 120/74 (BP Location: Right Arm)   Pulse 74   Temp 98.2 F (36.8 C) (Oral)   Resp 18   Ht 5\' 11"  (1.803 m)   Wt 69.4 kg   SpO2 99%   BMI 21.34 kg/m  Vital signs in last 24 hours: Temp:  [97.6 F (36.4 C)-98.9 F (37.2 C)] 98.2 F (36.8 C) (07/02 0439) Pulse Rate:  [53-96] 74 (07/02 0439) Resp:  [14-20] 18 (07/02 0439) BP: (108-143)/(69-104) 120/74 (07/02 0439) SpO2:  [93 %-100 %] 99 % (07/02 0439) Weight:  [63.5 kg-69.4 kg] 69.4 kg (07/01 2232)  Intake/Output from previous day: No intake/output data recorded. Intake/Output this shift: No intake/output data recorded. Nutritional status:  Diet Order            Diet regular Room service appropriate? Yes; Fluid consistency: Thin  Diet effective now             General: Well appearing male, NAD, laying in bed  Neurologic Exam: Mental status: Alert and oriented x3, speech fluent, follows commands. Cranial Nerves: II:  Visual acuity intact, pupils 3mm, equal, round, reactive to light and accomodation. III,IV, VI: EOMI, ptosis not present V,VII: smile symmetric, facial light touch sensation normal bilaterally VIII: hearing normal bilaterally IX,X: uvula rises symmetrically XI: Shoulder shrug normal on right, left limited by cast and pain XII: midline tongue extension without atrophy or fasciculations Motor: Right : Upper extremity   5/5    Left:     Upper extremity  Limited by pain  Lower extremity   5/5     Lower extremity   5/5 Tone and bulk:normal tone throughout; no atrophy noted Sensory: Sensation to light touch intact throughout, bilaterally Deep Tendon Reflexes:  Right: Upper Extremity   Left: Upper extremity   biceps (C-5 to C-6) 2/4   biceps (C-5 to C-6) 2/4  Lower Extremity Lower Extremity  quadriceps  (L-2 to L-4) 2/4   quadriceps (L-2 to L-4) 2/4  Plantars: Right: downgoing   Left: downgoing Cerebellar: normal heel-to-shin test, finger to nose test limited due to left shoulder pain Gait: did not assess     Lab Results: Results for orders placed or performed during the hospital encounter of 08/17/18 (from the past 48 hour(s))  POC CBG, ED     Status: Abnormal   Collection Time: 08/17/18  1:42 PM  Result Value Ref Range   Glucose-Capillary 174 (H) 70 - 99 mg/dL  Comprehensive metabolic panel     Status: Abnormal   Collection Time: 08/17/18  1:50 PM  Result Value Ref Range   Sodium 137 135 - 145 mmol/L   Potassium 3.5 3.5 - 5.1 mmol/L   Chloride 103 98 - 111 mmol/L   CO2 10 (L) 22 - 32 mmol/L   Glucose, Bld 217 (H) 70 - 99 mg/dL   BUN 7 6 - 20 mg/dL   Creatinine, Ser 6.041.45 (H) 0.61 - 1.24 mg/dL   Calcium 9.9 8.9 - 54.010.3 mg/dL   Total Protein 8.9 (H) 6.5 - 8.1 g/dL   Albumin 4.7 3.5 - 5.0 g/dL   AST 37 15 - 41 U/L   ALT 26 0 - 44 U/L   Alkaline Phosphatase 81 38 - 126 U/L   Total Bilirubin 1.2 0.3 - 1.2 mg/dL   GFR calc non  Af Amer >60 >60 mL/min   GFR calc Af Amer >60 >60 mL/min   Anion gap 24 (H) 5 - 15    Comment: Performed at [facility], [address] [place], [place] [address]  CBC with Differential     Status: Abnormal   Collection Time: 08/17/18  1:50 PM  Result Value Ref Range   WBC 15.2 (H) 4.0 - 10.5 K/uL   RBC 5.80 4.22 - 5.81 MIL/uL   Hemoglobin 17.7 (H) 13.0 - 17.0 g/dL   HCT 60.455.5 (H) 54.039.0 - 98.152.0 %   MCV 95.7 80.0 - 100.0 fL   MCH 30.5 26.0 - 34.0 pg   MCHC 31.9 30.0 - 36.0 g/dL   RDW 19.113.4 47.811.5 - 29.515.5 %   Platelets 406 (H) 150 - 400 K/uL   nRBC 0.0 0.0 - 0.2 %   Neutrophils Relative % 75 %   Neutro Abs 11.5 (H) 1.7 - 7.7 K/uL   Lymphocytes Relative 14 %   Lymphs Abs 2.1 0.7 - 4.0 K/uL   Monocytes Relative 9 %   Monocytes Absolute 1.3 (H) 0.1 - 1.0 K/uL   Eosinophils Relative 0 %   Eosinophils Absolute 0.0 0.0 - 0.5 K/uL   Basophils  Relative 0 %   Basophils Absolute 0.0 0.0 - 0.1 K/uL   Immature Granulocytes 2 %   Abs Immature Granulocytes 0.26 (H) 0.00 - 0.07 K/uL    Comment: Performed at [facility], [address] [place], [place] [address]  Ethanol     Status: None   Collection Time: 08/17/18  1:50 PM  Result Value Ref Range   Alcohol, Ethyl (B) <10 <10 mg/dL    Comment: (NOTE) Lowest detectable limit for serum alcohol is 10 mg/dL. For medical purposes only. Performed at [facility], [address] [place], [place] [address]   Urinalysis, Routine w reflex microscopic     Status: Abnormal   Collection Time: 08/17/18  5:00 PM  Result Value Ref Range   Color, Urine YELLOW YELLOW   APPearance CLOUDY (A) CLEAR   Specific Gravity, Urine 1.015 1.005 - 1.030   pH 5.0 5.0 - 8.0   Glucose, UA NEGATIVE NEGATIVE mg/dL   Hgb urine dipstick SMALL (A) NEGATIVE   Bilirubin Urine NEGATIVE NEGATIVE   Ketones, ur 20 (A) NEGATIVE mg/dL   Protein, ur 469100 (A) NEGATIVE mg/dL   Nitrite NEGATIVE NEGATIVE   Leukocytes,Ua NEGATIVE NEGATIVE   RBC / HPF 0-5 0 - 5 RBC/hpf   WBC, UA 0-5 0 - 5 WBC/hpf   Bacteria, UA RARE (A) NONE SEEN   Mucus PRESENT    Hyaline Casts, UA PRESENT     Comment: Performed at [facility], [address] [place], [place] [address]  Rapid urine drug screen (hospital performed)     Status: Abnormal   Collection Time: 08/17/18  5:00 PM  Result Value Ref Range   Opiates POSITIVE (A) NONE DETECTED   Cocaine NONE DETECTED NONE DETECTED   Benzodiazepines NONE DETECTED NONE DETECTED   Amphetamines NONE DETECTED NONE DETECTED   Tetrahydrocannabinol POSITIVE (A) NONE DETECTED   Barbiturates NONE DETECTED NONE DETECTED    Comment: (NOTE) DRUG SCREEN FOR MEDICAL PURPOSES ONLY.  IF CONFIRMATION IS NEEDED FOR ANY PURPOSE, NOTIFY LAB WITHIN 5 DAYS. LOWEST DETECTABLE LIMITS FOR URINE DRUG SCREEN Drug Class                     Cutoff (ng/mL)  Amphetamine and metabolites     1000 Barbiturate and metabolites    200 Benzodiazepine                 200 Tricyclics and metabolites     300 Opiates and metabolites        300 Cocaine and metabolites        300 THC                            50 Performed at [facility], [address], [place], [place] [address]   SARS Coronavirus 2 (CEPHEID - Performed in [facility] hospital lab), Hosp Order     Status: None   Collection Time: 08/17/18  6:32 PM   Specimen: Nasopharyngeal Swab  Result Value Ref Range   SARS Coronavirus 2 NEGATIVE NEGATIVE    Comment: (NOTE) If result is NEGATIVE SARS-CoV-2 target nucleic acids are NOT DETECTED. The SARS-CoV-2 RNA is generally detectable in upper and lower  respiratory specimens during the acute phase of infection. The lowest  concentration of SARS-CoV-2 viral copies this assay can detect is 250  copies / mL. A negative result does not preclude SARS-CoV-2 infection  and should not be used as the sole basis for treatment or other  patient management decisions.  A negative result may occur with  improper specimen collection / handling, submission of specimen other  than nasopharyngeal swab, presence of viral mutation(s) within the  areas targeted by this assay, and inadequate number of viral copies  (<250 copies / mL). A negative result must be combined with clinical  observations, patient history, and epidemiological information. If result is POSITIVE SARS-CoV-2 target nucleic acids are DETECTED. The SARS-CoV-2 RNA is generally detectable in upper and lower  respiratory specimens dur ing the acute phase of infection.  Positive  results are indicative of active infection with SARS-CoV-2.  Clinical  correlation with patient history and other diagnostic information is  necessary to determine patient infection status.  Positive results do  not rule out bacterial infection or co-infection with other viruses. If result is PRESUMPTIVE POSTIVE SARS-CoV-2 nucleic acids MAY BE  PRESENT.   A presumptive positive result was obtained on the submitted specimen  and confirmed on repeat testing.  While 2019 novel coronavirus  (SARS-CoV-2) nucleic acids may be present in the submitted sample  additional confirmatory testing may be necessary for epidemiological  and / or clinical management purposes  to differentiate between  SARS-CoV-2 and other Sarbecovirus currently known to infect humans.  If clinically indicated additional testing with an alternate test  methodology [phone]) is advised. The SARS-CoV-2 RNA is generally  detectable in upper and lower respiratory sp ecimens during the acute  phase of infection. The expected result is Negative. Fact Sheet for Patients:  BoilerBrush.com.cy Fact Sheet for Healthcare Providers: https://pope.com/ This test is not yet approved or cleared by the [place] FDA and has been authorized for detection and/or diagnosis of SARS-CoV-2 by FDA under an Emergency Use Authorization (EUA).  This EUA will remain in effect (meaning this test can be used) for the duration of the COVID-19 declaration under Section 564(b)(1) of the Act, 21 U.S.C. section 360bbb-3(b)(1), unless the authorization is terminated or revoked sooner. Performed at [facility], [address] [place], [place] [address]     Recent Results (from the past 240 hour(s))  SARS Coronavirus 2 (CEPHEID - Performed in [facility] hospital lab), [facility]  Status: None   Collection Time: 08/17/18  6:32 PM   Specimen: Nasopharyngeal Swab  Result Value Ref Range Status   SARS Coronavirus 2 NEGATIVE NEGATIVE Final    Comment: (NOTE) If result is NEGATIVE SARS-CoV-2 target nucleic acids are NOT DETECTED. The SARS-CoV-2 RNA is generally detectable in upper and lower  respiratory specimens during the acute phase of infection. The lowest  concentration of SARS-CoV-2 viral copies this assay can detect is 250   copies / mL. A negative result does not preclude SARS-CoV-2 infection  and should not be used as the sole basis for treatment or other  patient management decisions.  A negative result may occur with  improper specimen collection / handling, submission of specimen other  than nasopharyngeal swab, presence of viral mutation(s) within the  areas targeted by this assay, and inadequate number of viral copies  (<250 copies / mL). A negative result must be combined with clinical  observations, patient history, and epidemiological information. If result is POSITIVE SARS-CoV-2 target nucleic acids are DETECTED. The SARS-CoV-2 RNA is generally detectable in upper and lower  respiratory specimens dur ing the acute phase of infection.  Positive  results are indicative of active infection with SARS-CoV-2.  Clinical  correlation with patient history and other diagnostic information is  necessary to determine patient infection status.  Positive results do  not rule out bacterial infection or co-infection with other viruses. If result is PRESUMPTIVE POSTIVE SARS-CoV-2 nucleic acids MAY BE PRESENT.   A presumptive positive result was obtained on the submitted specimen  and confirmed on repeat testing.  While 2019 novel coronavirus  (SARS-CoV-2) nucleic acids may be present in the submitted sample  additional confirmatory testing may be necessary for epidemiological  and / or clinical management purposes  to differentiate between  SARS-CoV-2 and other Sarbecovirus currently known to infect humans.  If clinically indicated additional testing with an alternate test  methodology [phone]) is advised. The SARS-CoV-2 RNA is generally  detectable in upper and lower respiratory sp ecimens during the acute  phase of infection. The expected result is Negative. Fact Sheet for Patients:  StrictlyIdeas.no Fact Sheet for Healthcare  Providers: BankingDealers.co.za This test is not yet approved or cleared by the [place] FDA and has been authorized for detection and/or diagnosis of SARS-CoV-2 by FDA under an Emergency Use Authorization (EUA).  This EUA will remain in effect (meaning this test can be used) for the duration of the COVID-19 declaration under Section 564(b)(1) of the Act, 21 U.S.C. section 360bbb-3(b)(1), unless the authorization is terminated or revoked sooner. Performed at [facility], [address], [place], [place] [address]     Lipid Panel No results for input(s): CHOL, TRIG, HDL, CHOLHDL, VLDL, LDLCALC in the last 72 hours.  Studies/Results: Ct Head Wo Contrast  Result Date: 08/17/2018 CLINICAL DATA:  Pain following fall with seizure EXAM: CT HEAD WITHOUT CONTRAST CT CERVICAL SPINE WITHOUT CONTRAST TECHNIQUE: Multidetector CT imaging of the head and cervical spine was performed following the standard protocol without intravenous contrast. Multiplanar CT image reconstructions of the cervical spine were also generated. COMPARISON:  CT head and CT cervical spine January 30, 2018 FINDINGS: CT HEAD FINDINGS Brain: Note that there is a degree of motion artifact. Ventricles appear normal in size and configuration. There is no evident intracranial mass, hemorrhage, extra-axial fluid collection, or midline shift. Brain parenchyma appears unremarkable. No acute infarct evident. Vascular: No hyperdense vessel. No vascular lesions are demonstrable. Skull: Bony calvarium appears intact.  There is a left frontal scalp hematoma. Sinuses/Orbits: There is a retention cyst in the inferior left maxillary antrum. There is a small retention cyst in the right maxillary antrum more superiorly. Other paranasal sinuses which are visualized are clear. Orbits appear symmetric bilaterally. Other: Mastoid air cells are clear. There is debris in each external auditory canal. CT CERVICAL SPINE FINDINGS  Alignment: There is no demonstrable spondylolisthesis. Skull base and vertebrae: Skull base and craniocervical junction regions appear normal. No evident fracture. No blastic or lytic bone lesions. Soft tissues and spinal canal: Prevertebral soft tissues and predental space regions are normal. No paraspinous lesions. No evident cord or canal hematoma. Disc levels: Disc spaces appear normal. No nerve root edema or effacement. No disc extrusion or stenosis. Upper chest: Visualized upper lung regions are clear. Other: None IMPRESSION: CT head: Mild degree of motion artifact. The brain parenchyma appears within normal limits. No mass or hemorrhage. Left frontal scalp hematoma.  No fracture evident. Mild paranasal sinus disease. Probable cerumen in each external auditory canal. CT cervical spine: No fracture or spondylolisthesis. No appreciable disc extrusion or stenosis. No appreciable arthropathy. Electronically Signed   By: Bretta BangWilliam  Woodruff III M.D.   On: 08/17/2018 14:46   Ct Cervical Spine Wo Contrast  Result Date: 08/17/2018 CLINICAL DATA:  Pain following fall with seizure EXAM: CT HEAD WITHOUT CONTRAST CT CERVICAL SPINE WITHOUT CONTRAST TECHNIQUE: Multidetector CT imaging of the head and cervical spine was performed following the standard protocol without intravenous contrast. Multiplanar CT image reconstructions of the cervical spine were also generated. COMPARISON:  CT head and CT cervical spine January 30, 2018 FINDINGS: CT HEAD FINDINGS Brain: Note that there is a degree of motion artifact. Ventricles appear normal in size and configuration. There is no evident intracranial mass, hemorrhage, extra-axial fluid collection, or midline shift. Brain parenchyma appears unremarkable. No acute infarct evident. Vascular: No hyperdense vessel. No vascular lesions are demonstrable. Skull: Bony calvarium appears intact. There is a left frontal scalp hematoma. Sinuses/Orbits: There is a retention cyst in the inferior  left maxillary antrum. There is a small retention cyst in the right maxillary antrum more superiorly. Other paranasal sinuses which are visualized are clear. Orbits appear symmetric bilaterally. Other: Mastoid air cells are clear. There is debris in each external auditory canal. CT CERVICAL SPINE FINDINGS Alignment: There is no demonstrable spondylolisthesis. Skull base and vertebrae: Skull base and craniocervical junction regions appear normal. No evident fracture. No blastic or lytic bone lesions. Soft tissues and spinal canal: Prevertebral soft tissues and predental space regions are normal. No paraspinous lesions. No evident cord or canal hematoma. Disc levels: Disc spaces appear normal. No nerve root edema or effacement. No disc extrusion or stenosis. Upper chest: Visualized upper lung regions are clear. Other: None IMPRESSION: CT head: Mild degree of motion artifact. The brain parenchyma appears within normal limits. No mass or hemorrhage. Left frontal scalp hematoma.  No fracture evident. Mild paranasal sinus disease. Probable cerumen in each external auditory canal. CT cervical spine: No fracture or spondylolisthesis. No appreciable disc extrusion or stenosis. No appreciable arthropathy. Electronically Signed   By: Bretta BangWilliam  Woodruff III M.D.   On: 08/17/2018 14:46   Mr Laqueta JeanBrain W ZOWo Contrast  Result Date: 08/17/2018 CLINICAL DATA:  28 y/o  M; new diagnosis of seizure. EXAM: MRI HEAD WITHOUT AND WITH CONTRAST TECHNIQUE: Multiplanar, multiecho pulse sequences of the brain and surrounding structures were obtained without and with intravenous contrast. CONTRAST:  6 cc Gadavist COMPARISON:  08/17/2018 CT  head. FINDINGS: Brain: No acute infarction, hemorrhage, hydrocephalus, extra-axial collection or mass lesion. Midline structures are intact including a morphologically normal pituitary as well as a complete corpus callosum and vermis. No Chiari malformation. No evidence of cortical dysplasia, heterotopia, or  disorder of cortical formation is identified. Hippocampi by are symmetric in size and signal bilaterally. After administration of intravenous contrast there is no abnormal enhancement. Vascular: Normal flow voids. Skull and upper cervical spine: Normal marrow signal. Sinuses/Orbits: Left frontal scalp swelling probably small contusion. Small mucous retention cysts within the left maxillary sinus. Otherwise negative. Other: None. IMPRESSION: 1. No acute process or structural cause of seizure identified. No abnormal enhancement. Unremarkable MRI of the brain. 2. Small left frontal scalp contusion. Electronically Signed   By: Mitzi Hansen M.D.   On: 08/17/2018 21:24   Dg Shoulder Left Portable  Result Date: 08/17/2018 CLINICAL DATA:  Status post reduction EXAM: LEFT SHOULDER - 1 VIEW COMPARISON:  Films from earlier in the same day. 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 The humeral head is now relocated in the glenoid fossa. No acute fracture is seen. IMPRESSION: Interval reduction of dislocation. Electronically Signed   By: Alcide Clever M.D.   On: 08/17/2018 18:48   Dg Shoulder Left Portable  Result Date: 08/17/2018 CLINICAL DATA:  Concern for left shoulder dislocation today with multiple prior dislocations in the past 6 months. EXAM: LEFT SHOULDER - 1 VIEW COMPARISON:  07/23/2018 FINDINGS: There is a recurrent anterior dislocation of the humeral head relative to the glenoid. A chronic Hill-Sachs deformity is again noted with an adjacent 2 mm focus of bone or soft tissue calcification. No definite acute fracture is identified. The visualized portion of the left lung is clear. IMPRESSION: Recurrent anterior left glenohumeral dislocation. Electronically Signed   By: Sebastian Ache M.D.   On: 08/17/2018 09:54    Medications:  Scheduled: . levETIRAcetam  500 mg Oral BID  PRN Meds:.ondansetron (ZOFRAN) IV, oxyCODONE-acetaminophen  @  Assessment: This is a 28 year old male  with a witnessed first time seizure in Dec who initially presented with a dislocated left shoulder. While in the hospital he had a witnessed seizure, falling out of bed and striking his head.   1. 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  2. Yesterday, he was loaded with Keppra 1000 mg and started on keppra 500 mg BID 3. EEG showed a normal awake EEG, no epileptiform activity noted 4. MRI showed no acute intracranial findings. A small left frontal scalp contusion was noted.  5. No clear provoking factors at this time  Plan: - Continue seizure precautions - Continue Keppra 500 mg BID at discharge - Follow up with neurology outpatient - Per [facility] statutes, patients with seizures are not allowed to drive until  they have been seizure-free for six months. Use caution when using heavy equipment or power tools. Avoid working on ladders or at heights. Take showers instead of baths. Ensure the water temperature is not too high on the home water heater. Do not go swimming alone. When caring for infants or small children, sit down when holding, feeding, or changing them to minimize risk of injury to the child in the event you have a seizure. Also, Maintain good sleep hygiene. Avoid alcohol. -Neurology will sign off, please call with any additional questions    LOS: 0 days    signed: Dr. Caryl Pina  08/18/2018  8:16 AM

## 2018-08-18 NOTE — Progress Notes (Signed)
Report received from [place] at 2110. Pt on unit at 2128, AOx4, diet ordered, dietary services explained and menu provided to pt. Pt noted to have nausea and vomiting upon admission to unit. Anti-emetic ordered and admin. No seizure activity noted this shift.

## 2018-08-18 NOTE — Discharge Summary (Addendum)
Physician Discharge Summary  Jason Davenport PRF:[id number] DOB: [birth year] DOA: 08/17/2018  PCP: Patient, No Pcp Per patient was referred to community health and wellness  Admit date: 08/17/2018 Discharge date: 08/18/2018  Admitted From: home Discharge disposition: home   Recommendations for Outpatient Follow-Up:   1. BMP 1 week 2. Seizure precautions   Discharge Diagnosis:   Principal Problem:   Seizure [facility]) Active Problems:   Anterior shoulder dislocation, left, initial encounter    Discharge Condition: Improved.  Diet recommendation: Regular.  Wound care: None.  Code status: Full.   History of Present Illness:   Jason Davenport is a 28 y.o. male with medical history significant of no significant past medical history except for an episode of seizure many months ago who presented to the ER after suspected seizure overnight.  He was found to have dislocated left shoulder which is his presenting complaint.  Patient initially thought he slept wrongly on the left shoulder.  He came to the ER where he was seen and evaluated.  He denied any fall.  Patient has visible forehead hematoma indicating possible fall.  He has severe pain and not able to use his left shoulder.  Work-up was initiated in the ER including EEG.  MRI of the brain has been ordered.  His left shoulder has been relocated.  He had one episode of seizure in the ER where he hit his forehead indicating that patient has had recurrent seizure.  He has received Ativan and loaded with Keppra.  Neurology was consulted and recommended observation and continue with Keppra.  Patient reported not able to eat or drink for days.   Hospital Course by Problem:   This is a 28 year old male with a witnessed first time seizure in Dec who initially presented with a dislocated left shoulder. While in the hospital he had a witnessed seizure, falling out of bed and striking his head.   1. 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  2. Yesterday, he was loaded with Keppra 1000 mg and started on keppra 500 mg BID 3. EEG showed a normal awake EEG, no epileptiform activity noted 4. MRI showed no acute intracranial findings. A small left frontal scalp contusion was noted.  5. No clear provoking factors at this time  Plan: - Continue seizure precautions - Continue Keppra 500 mg BID at discharge - Follow up with neurology outpatient    Medical Consultants:   neurology   Discharge Exam:   Vitals:   08/18/18 0439 08/18/18 0900  BP: 120/74 125/80  Pulse: 74 80  Resp: 18 20  Temp: 98.2 F (36.8 C) 98.8 F (37.1 C)  SpO2: 99% 100%   Vitals:   08/17/18 2232 08/17/18 2351 08/18/18 0439 08/18/18 0900  BP:  116/74 120/74 125/80  Pulse:  87 74 80  Resp:  20 18 20   Temp:  98.9 F (37.2 C) 98.2 F (36.8 C) 98.8 F (37.1 C)  TempSrc:   Oral Oral  SpO2:  100% 99% 100%  Weight: 69.4 kg     Height: 5\' 11"  (1.803 m)       General exam: Appears calm and comfortable.  The results of significant diagnostics from this hospitalization (including imaging, microbiology, ancillary and laboratory) are listed below for reference.     Procedures and Diagnostic Studies:   Ct Head Wo Contrast  Result Date: 08/17/2018 CLINICAL DATA:  Pain  following fall with seizure EXAM: CT HEAD WITHOUT CONTRAST CT CERVICAL SPINE WITHOUT CONTRAST TECHNIQUE: Multidetector CT imaging of the head and cervical spine was performed following the standard protocol without intravenous contrast. Multiplanar CT image reconstructions of the cervical spine were also generated. COMPARISON:  CT head and CT cervical spine January 30, 2018 FINDINGS: CT HEAD FINDINGS Brain: Note that there is a degree of motion artifact. Ventricles appear normal in size and configuration. There is no evident intracranial mass, hemorrhage, extra-axial fluid collection, or midline shift.  Brain parenchyma appears unremarkable. No acute infarct evident. Vascular: No hyperdense vessel. No vascular lesions are demonstrable. Skull: Bony calvarium appears intact. There is a left frontal scalp hematoma. Sinuses/Orbits: There is a retention cyst in the inferior left maxillary antrum. There is a small retention cyst in the right maxillary antrum more superiorly. Other paranasal sinuses which are visualized are clear. Orbits appear symmetric bilaterally. Other: Mastoid air cells are clear. There is debris in each external auditory canal. CT CERVICAL SPINE FINDINGS Alignment: There is no demonstrable spondylolisthesis. Skull base and vertebrae: Skull base and craniocervical junction regions appear normal. No evident fracture. No blastic or lytic bone lesions. Soft tissues and spinal canal: Prevertebral soft tissues and predental space regions are normal. No paraspinous lesions. No evident cord or canal hematoma. Disc levels: Disc spaces appear normal. No nerve root edema or effacement. No disc extrusion or stenosis. Upper chest: Visualized upper lung regions are clear. Other: None IMPRESSION: CT head: Mild degree of motion artifact. The brain parenchyma appears within normal limits. No mass or hemorrhage. Left frontal scalp hematoma.  No fracture evident. Mild paranasal sinus disease. Probable cerumen in each external auditory canal. CT cervical spine: No fracture or spondylolisthesis. No appreciable disc extrusion or stenosis. No appreciable arthropathy. Electronically Signed   By: Bretta Bang III M.D.   On: 08/17/2018 14:46   Ct Cervical Spine Wo Contrast  Result Date: 08/17/2018 CLINICAL DATA:  Pain following fall with seizure EXAM: CT HEAD WITHOUT CONTRAST CT CERVICAL SPINE WITHOUT CONTRAST TECHNIQUE: Multidetector CT imaging of the head and cervical spine was performed following the standard protocol without intravenous contrast. Multiplanar CT image reconstructions of the cervical spine were  also generated. COMPARISON:  CT head and CT cervical spine January 30, 2018 FINDINGS: CT HEAD FINDINGS Brain: Note that there is a degree of motion artifact. Ventricles appear normal in size and configuration. There is no evident intracranial mass, hemorrhage, extra-axial fluid collection, or midline shift. Brain parenchyma appears unremarkable. No acute infarct evident. Vascular: No hyperdense vessel. No vascular lesions are demonstrable. Skull: Bony calvarium appears intact. There is a left frontal scalp hematoma. Sinuses/Orbits: There is a retention cyst in the inferior left maxillary antrum. There is a small retention cyst in the right maxillary antrum more superiorly. Other paranasal sinuses which are visualized are clear. Orbits appear symmetric bilaterally. Other: Mastoid air cells are clear. There is debris in each external auditory canal. CT CERVICAL SPINE FINDINGS Alignment: There is no demonstrable spondylolisthesis. Skull base and vertebrae: Skull base and craniocervical junction regions appear normal. No evident fracture. No blastic or lytic bone lesions. Soft tissues and spinal canal: Prevertebral soft tissues and predental space regions are normal. No paraspinous lesions. No evident cord or canal hematoma. Disc levels: Disc spaces appear normal. No nerve root edema or effacement. No disc extrusion or stenosis. Upper chest: Visualized upper lung regions are clear. Other: None IMPRESSION: CT head: Mild degree of motion artifact. The brain parenchyma appears within normal  limits. No mass or hemorrhage. Left frontal scalp hematoma.  No fracture evident. Mild paranasal sinus disease. Probable cerumen in each external auditory canal. CT cervical spine: No fracture or spondylolisthesis. No appreciable disc extrusion or stenosis. No appreciable arthropathy. Electronically Signed   By: Bretta BangWilliam  Woodruff III M.D.   On: 08/17/2018 14:46   Mr Laqueta JeanBrain W ZOWo Contrast  Result Date: 08/17/2018 CLINICAL DATA:  28 y/o   M; new diagnosis of seizure. EXAM: MRI HEAD WITHOUT AND WITH CONTRAST TECHNIQUE: Multiplanar, multiecho pulse sequences of the brain and surrounding structures were obtained without and with intravenous contrast. CONTRAST:  6 cc Gadavist COMPARISON:  08/17/2018 CT head. FINDINGS: Brain: No acute infarction, hemorrhage, hydrocephalus, extra-axial collection or mass lesion. Midline structures are intact including a morphologically normal pituitary as well as a complete corpus callosum and vermis. No Chiari malformation. No evidence of cortical dysplasia, heterotopia, or disorder of cortical formation is identified. Hippocampi by are symmetric in size and signal bilaterally. After administration of intravenous contrast there is no abnormal enhancement. Vascular: Normal flow voids. Skull and upper cervical spine: Normal marrow signal. Sinuses/Orbits: Left frontal scalp swelling probably small contusion. Small mucous retention cysts within the left maxillary sinus. Otherwise negative. Other: None. IMPRESSION: 1. No acute process or structural cause of seizure identified. No abnormal enhancement. Unremarkable MRI of the brain. 2. Small left frontal scalp contusion. Electronically Signed   By: Mitzi HansenLance  Furusawa-Stratton M.D.   On: 08/17/2018 21:24   Dg Shoulder Left Portable  Result Date: 08/17/2018 CLINICAL DATA:  Status post reduction EXAM: LEFT SHOULDER - 1 VIEW COMPARISON:  Films from earlier in the same day. 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 The humeral head is now relocated in the glenoid fossa. No acute fracture is seen. IMPRESSION: Interval reduction of dislocation. Electronically Signed   By: Alcide CleverMark  Lukens M.D.   On: 08/17/2018 18:48   Dg Shoulder Left Portable  Result Date: 08/17/2018 CLINICAL DATA:  Concern for left shoulder dislocation today with multiple prior dislocations in the past 6 months. EXAM: LEFT SHOULDER - 1 VIEW COMPARISON:  07/23/2018 FINDINGS: There is a recurrent anterior  dislocation of the humeral head relative to the glenoid. A chronic Hill-Sachs deformity is again noted with an adjacent 2 mm focus of bone or soft tissue calcification. No definite acute fracture is identified. The visualized portion of the left lung is clear. IMPRESSION: Recurrent anterior left glenohumeral dislocation. Electronically Signed   By: Sebastian AcheAllen  Grady M.D.   On: 08/17/2018 09:54     Labs:   Basic Metabolic Panel: Recent Labs  Lab 08/17/18 1350  NA 137  K 3.5  CL 103  CO2 10*  GLUCOSE 217*  BUN 7  CREATININE 1.45*  CALCIUM 9.9   GFR Estimated Creatinine Clearance: 75.8 mL/min (A) (by C-G formula based on SCr of 1.45 mg/dL (H)). Liver Function Tests: Recent Labs  Lab 08/17/18 1350  AST 37  ALT 26  ALKPHOS 81  BILITOT 1.2  PROT 8.9*  ALBUMIN 4.7   No results for input(s): LIPASE, AMYLASE in the last 168 hours. No results for input(s): AMMONIA in the last 168 hours. Coagulation profile No results for input(s): INR, PROTIME in the last 168 hours.  CBC: Recent Labs  Lab 08/17/18 1350  WBC 15.2*  NEUTROABS 11.5*  HGB 17.7*  HCT 55.5*  MCV 95.7  PLT 406*   Cardiac Enzymes: No results for input(s): CKTOTAL, CKMB, CKMBINDEX, TROPONINI in the last 168 hours. BNP: Invalid input(s): POCBNP CBG: Recent  Labs  Lab 08/17/18 1342  GLUCAP 174*   D-Dimer No results for input(s): DDIMER in the last 72 hours. Hgb A1c No results for input(s): HGBA1C in the last 72 hours. Lipid Profile No results for input(s): CHOL, HDL, LDLCALC, TRIG, CHOLHDL, LDLDIRECT in the last 72 hours. Thyroid function studies No results for input(s): TSH, T4TOTAL, T3FREE, THYROIDAB in the last 72 hours.  Invalid input(s): FREET3 Anemia work up No results for input(s): VITAMINB12, FOLATE, FERRITIN, TIBC, IRON, RETICCTPCT in the last 72 hours. Microbiology Recent Results (from the past 240 hour(s))  SARS Coronavirus 2 (CEPHEID - Performed in [facility] lab), Hosp Order      Status: None   Collection Time: 08/17/18  6:32 PM   Specimen: Nasopharyngeal Swab  Result Value Ref Range Status   SARS Coronavirus 2 NEGATIVE NEGATIVE Final    Comment: (NOTE) If result is NEGATIVE SARS-CoV-2 target nucleic acids are NOT DETECTED. The SARS-CoV-2 RNA is generally detectable in upper and lower  respiratory specimens during the acute phase of infection. The lowest  concentration of SARS-CoV-2 viral copies this assay can detect is 250  copies / mL. A negative result does not preclude SARS-CoV-2 infection  and should not be used as the sole basis for treatment or other  patient management decisions.  A negative result may occur with  improper specimen collection / handling, submission of specimen other  than nasopharyngeal swab, presence of viral mutation(s) within the  areas targeted by this assay, and inadequate number of viral copies  (<250 copies / mL). A negative result must be combined with clinical  observations, patient history, and epidemiological information. If result is POSITIVE SARS-CoV-2 target nucleic acids are DETECTED. The SARS-CoV-2 RNA is generally detectable in upper and lower  respiratory specimens dur ing the acute phase of infection.  Positive  results are indicative of active infection with SARS-CoV-2.  Clinical  correlation with patient history and other diagnostic information is  necessary to determine patient infection status.  Positive results do  not rule out bacterial infection or co-infection with other viruses. If result is PRESUMPTIVE POSTIVE SARS-CoV-2 nucleic acids MAY BE PRESENT.   A presumptive positive result was obtained on the submitted specimen  and confirmed on repeat testing.  While 2019 novel coronavirus  (SARS-CoV-2) nucleic acids may be present in the submitted sample  additional confirmatory testing may be necessary for epidemiological  and / or clinical management purposes  to differentiate between  SARS-CoV-2 and other  Sarbecovirus currently known to infect humans.  If clinically indicated additional testing with an alternate test  methodology [phone] is advised. The SARS-CoV-2 RNA is generally  detectable in upper and lower respiratory sp ecimens during the acute  phase of infection. The expected result is Negative. Fact Sheet for Patients:  BoilerBrush.com.cyhttps://www.fda.gov/media/136312/download Fact Sheet for Healthcare Providers: https://pope.com/https://www.fda.gov/media/136313/download This test is not yet approved or cleared by the [place] FDA and has been authorized for detection and/or diagnosis of SARS-CoV-2 by FDA under an Emergency Use Authorization (EUA).  This EUA will remain in effect (meaning this test can be used) for the duration of the COVID-19 declaration under Section 564(b)(1) of the Act, 21 U.S.C. section 360bbb-3(b)(1), unless the authorization is terminated or revoked sooner. Performed at [facility], [address] [place], [place] [address]      Discharge Instructions:   Discharge Instructions    Ambulatory referral to Neurology   Complete by: As directed    An appointment is requested in  approximately: 4 weeks re: seizures   Diet general   Complete by: As directed    Discharge instructions   Complete by: As directed    Per [place] DMV statutes, patients with seizures are not allowed to drive until [facility] for six months.   Use caution when using heavy equipment or power tools. Avoid working on ladders or at heights. Take showers instead of baths. Ensure the water temperature is not too high on the home water heater. Do not go swimming alone. Do not lock yourself in a room alone (i.e. bathroom). When caring for infants or small children, sit down when holding, feeding, or changing them to minimize risk of injury to the child in the event you have a seizure. Maintain good sleep hygiene. Avoid alcohol.   If patienthas another seizure, call 911 and bring  them back to the ED if: A. The seizure lasts longer than 5 minutes.  B. The patient doesn't wake shortly after the seizure or has new problems such as difficulty seeing, speaking or moving following the seizure C. The patient was injured during the seizure D. The patient has a temperature over 102 F (39C) E. The patient vomited during the seizure and now is having trouble breathing   Increase activity slowly   Complete by: As directed      Allergies as of 08/18/2018   No Known Allergies     Medication List    STOP taking these medications   HYDROcodone-acetaminophen 5-325 MG tablet Commonly known as: NORCO/VICODIN     TAKE these medications   ibuprofen 600 MG tablet Commonly known as: ADVIL Take 1 tablet (600 mg total) by mouth every 6 (six) hours as needed.   levETIRAcetam 500 MG tablet Commonly known as: KEPPRA Take 1 tablet (500 mg total) by mouth 2 (two) times daily.      Follow-up Information    [facility] Follow up on 08/29/2018.   Why: Appt at 9:00 with  Dr[facility]Newlin [facility] Contact information: [address] [place] [id number] [phone]       CHL-CASE MANAGEMENT .            Time coordinating discharge: 35 min  Signed:  Joseph ArtJessica U Adley Castello DO  [facility] 08/18/2018, 3:07 PM

## 2018-08-18 NOTE — TOC Transition Note (Addendum)
Transition of Care [facility]) - CM/SW Discharge Note   Patient Details  Name: Rashard Ryle MRN: [id number] Date of Birth: 1990/11/10  [facility] CM/SW Contact:  Ella Bodo, RN Phone Number: 08/18/2018, 1:47 PM   Clinical Narrative:  Pt admitted on 08/17/18 with Lt shoulder pain and seizure activity.  PTA, pt independent, lives alone.  Pt medically stable for dc home today; he is uninsured, but is eligible for medication assistance through [facility] program.  Meds filled through [facility] using [place] letter.  Pt to follow up at [facility] for PCP, and follow up appointment made.  Appt information placed on AVS.  Pt denies any additional needs.       Final next level of care: Home/Self Care Barriers to Discharge: No Barriers Identified                       Discharge Plan and Services   Discharge Planning Services: CM Consult, Follow-up appt scheduled, MATCH Program, Medication Assistance                                   Readmission Risk Interventions No flowsheet data found.   Reinaldo Raddle, RN, BSN  Trauma/Neuro ICU Case Manager [phone]

## 2018-08-18 NOTE — Discharge Instructions (Signed)
Per [place] DMV statutes, patients with seizures are not allowed to drive until they have been seizure-free for six months.    Use caution when using heavy equipment or power tools. Avoid working on ladders or at heights. Take showers instead of baths. Ensure the water temperature is not too high on the home water heater. Do not go swimming alone. Do not lock yourself in a room alone (i.e. bathroom). When caring for infants or small children, sit down when holding, feeding, or changing them to minimize risk of injury to the child in the event you have a seizure. Maintain good sleep hygiene. Avoid alcohol.    If patient has another seizure, call 911 and bring them back to the ED if: A.  The seizure lasts longer than 5 minutes.      B.  The patient doesn't wake shortly after the seizure or has new problems such as difficulty seeing, speaking or moving following the seizure C.  The patient was injured during the seizure D.  The patient has a temperature over 102 F (39C) E.  The patient vomited during the seizure and now is having trouble breathing  

## 2018-08-18 NOTE — Plan of Care (Signed)
  Problem: Education: Goal: Expressions of having a comfortable level of knowledge regarding the disease process will increase Outcome: Progressing   Problem: Coping: Goal: Ability to adjust to condition or change in health will improve Outcome: Progressing Goal: Ability to identify appropriate support needs will improve Outcome: Progressing   Problem: Health Behavior/Discharge Planning: Goal: Compliance with prescribed medication regimen will improve Outcome: Progressing   Problem: Medication: Goal: Risk for medication side effects will decrease Outcome: Progressing   Problem: Clinical Measurements: Goal: Complications related to the disease process, condition or treatment will be avoided or minimized Outcome: Progressing Goal: Diagnostic test results will improve Outcome: Progressing   Problem: Safety: Goal: Verbalization of understanding the information provided will improve Outcome: Progressing   Problem: Self-Concept: Goal: Level of anxiety will decrease Outcome: Progressing Goal: Ability to verbalize feelings about condition will improve Outcome: Progressing   Khalea Ventura, BSN, RN 

## 2018-08-29 ENCOUNTER — Inpatient Hospital Stay: Payer: Self-pay | Admitting: Family Medicine

## 2018-09-22 ENCOUNTER — Emergency Department (HOSPITAL_COMMUNITY): Payer: Self-pay

## 2018-09-22 ENCOUNTER — Other Ambulatory Visit: Payer: Self-pay

## 2018-09-22 ENCOUNTER — Emergency Department (HOSPITAL_COMMUNITY)
Admission: EM | Admit: 2018-09-22 | Discharge: 2018-09-23 | Disposition: A | Discharge status: Home / Self Care | Payer: Self-pay | Attending: Emergency Medicine | Admitting: Emergency Medicine

## 2018-09-22 ENCOUNTER — Encounter (HOSPITAL_COMMUNITY): Payer: Self-pay | Admitting: Emergency Medicine

## 2018-09-22 DIAGNOSIS — S0012XA Contusion of left eyelid and periocular area, initial encounter: Secondary | ICD-10-CM | POA: Insufficient documentation

## 2018-09-22 DIAGNOSIS — S0181XA Laceration without foreign body of other part of head, initial encounter: Secondary | ICD-10-CM

## 2018-09-22 DIAGNOSIS — S01112A Laceration without foreign body of left eyelid and periocular area, initial encounter: Secondary | ICD-10-CM | POA: Insufficient documentation

## 2018-09-22 DIAGNOSIS — Y92238 Other place in hospital as the place of occurrence of the external cause: Secondary | ICD-10-CM | POA: Insufficient documentation

## 2018-09-22 DIAGNOSIS — R569 Unspecified convulsions: Secondary | ICD-10-CM | POA: Insufficient documentation

## 2018-09-22 DIAGNOSIS — W07XXXA Fall from chair, initial encounter: Secondary | ICD-10-CM | POA: Insufficient documentation

## 2018-09-22 DIAGNOSIS — Z23 Encounter for immunization: Secondary | ICD-10-CM | POA: Insufficient documentation

## 2018-09-22 DIAGNOSIS — S43015A Anterior dislocation of left humerus, initial encounter: Secondary | ICD-10-CM | POA: Insufficient documentation

## 2018-09-22 DIAGNOSIS — Y939 Activity, unspecified: Secondary | ICD-10-CM | POA: Insufficient documentation

## 2018-09-22 DIAGNOSIS — Y999 Unspecified external cause status: Secondary | ICD-10-CM | POA: Insufficient documentation

## 2018-09-22 LAB — CBG MONITORING, ED: Glucose-Capillary: 145 mg/dL — ABNORMAL HIGH (ref 70–99)

## 2018-09-22 MED ORDER — PROPOFOL 10 MG/ML IV BOLUS
200.0000 mg | Freq: Once | INTRAVENOUS | Status: AC
  Administered 2018-09-22: 23:00:00 200 mg via INTRAVENOUS
  Filled 2018-09-22: qty 20

## 2018-09-22 MED ORDER — TETANUS-DIPHTH-ACELL PERTUSSIS 5-2.5-18.5 LF-MCG/0.5 IM SUSP
0.5000 mL | Freq: Once | INTRAMUSCULAR | Status: AC
  Administered 2018-09-22: 21:00:00 0.5 mL via INTRAMUSCULAR
  Filled 2018-09-22: qty 0.5

## 2018-09-22 MED ORDER — LIDOCAINE-EPINEPHRINE (PF) 2 %-1:200000 IJ SOLN
5.0000 mL | Freq: Once | INTRAMUSCULAR | Status: AC
  Administered 2018-09-22: 5 mL via INTRADERMAL
  Filled 2018-09-22: qty 20

## 2018-09-22 MED ORDER — LIDOCAINE-EPINEPHRINE (PF) 2 %-1:200000 IJ SOLN
20.0000 mL | Freq: Once | INTRAMUSCULAR | Status: AC
  Administered 2018-09-22: 20 mL
  Filled 2018-09-22: qty 20

## 2018-09-22 MED ORDER — LEVETIRACETAM IN NACL 1000 MG/100ML IV SOLN
1000.0000 mg | Freq: Once | INTRAVENOUS | Status: AC
  Administered 2018-09-22: 18:00:00 1000 mg via INTRAVENOUS
  Filled 2018-09-22: qty 100

## 2018-09-22 MED ORDER — LORAZEPAM 2 MG/ML IJ SOLN
INTRAMUSCULAR | Status: AC
  Filled 2018-09-22: qty 1

## 2018-09-22 MED ORDER — LORAZEPAM 2 MG/ML IJ SOLN
2.0000 mg | Freq: Once | INTRAMUSCULAR | Status: AC
  Administered 2018-09-22: 2 mg via INTRAVENOUS

## 2018-09-22 MED ORDER — FENTANYL CITRATE (PF) 100 MCG/2ML IJ SOLN
50.0000 ug | Freq: Once | INTRAMUSCULAR | Status: DC
  Filled 2018-09-22: qty 2

## 2018-09-22 MED ORDER — FENTANYL CITRATE (PF) 100 MCG/2ML IJ SOLN
100.0000 ug | Freq: Once | INTRAMUSCULAR | Status: AC
  Administered 2018-09-22: 22:00:00 100 ug via INTRAVENOUS

## 2018-09-22 NOTE — ED Notes (Signed)
Pt had seizure in lobby, assisted onto stretcher by staff and taken to 23

## 2018-09-22 NOTE — ED Notes (Signed)
Pt noted to have full body seizure, pt fell off lobby bench, hitting his head. Laceration beside the left eye, bleeding profusely. Pt assisted to stretcher, taken to RM 23, verbal order 2mg  ativan per Hedges, PA.

## 2018-09-22 NOTE — ED Provider Notes (Signed)
[facility] EMERGENCY DEPARTMENT Provider Note   CSN: [id number] Arrival date & time: 09/22/18  1609    History   Chief Complaint Chief Complaint  Patient presents with  . Shoulder Injury    HPI Jason Davenport is a 28 y.o. male.     HPI  Patient presents for evaluation of left shoulder dislocation.  This was a chief complaint in triage, however while waiting to be roomed he fell and exhibited seizure-like activity.  Sustained laceration of the left eyebrow.  On my exam the patient was confused, eyes closed, minimally verbal but follow command, localizing the pain.  Not able to give additional history.  While in triage he had x-ray of the left shoulder which shows anterior dislocation with Hill-Sachs deformity and fragment consistent with Bankart fracture.  Past Medical History:  Diagnosis Date  . Seizure [facility])     Patient Active Problem List   Diagnosis Date Noted  . Seizure ([employer]) 08/17/2018  . Anterior shoulder dislocation, left, initial encounter 08/17/2018    History reviewed. No pertinent surgical history.      Home Medications    Prior to Admission medications   Medication Sig Start Date End Date Taking? Authorizing Provider  ibuprofen (ADVIL,MOTRIN) 600 MG tablet Take 1 tablet (600 mg total) by mouth every 6 (six) hours as needed. Patient not taking: Reported on 08/17/2018 01/30/18   Volanda Napoleon, PA-C  levETIRAcetam (KEPPRA) 500 MG tablet Take 1 tablet (500 mg total) by mouth 2 (two) times daily. 08/18/18   Geradine Girt, Jason Davenport    Family History Family History  Problem Relation Age of Onset  . Hypertension Mother   . Hypertension Father     Social History Social History   Tobacco Use  . Smoking status: Current Every Day Smoker  . Smokeless tobacco: Never Used  Substance Use Topics  . Alcohol use: No  . Drug use: No     Allergies   Patient has no known allergies.   Review of Systems Review of Systems  Reason unable to  perform ROS: Initially unable to obtain history from patient given postictal.  After he returned to baseline he confirmed that he had no complaints other than left shoulder pain.  Musculoskeletal:       Left shoulder pain  All other systems reviewed and are negative.   Physical Exam Updated Vital Signs BP (!) 145/85   Pulse 91   Temp 99.4 F (37.4 C) (Oral)   Resp (!) 23   SpO2 100%   Physical Exam Vitals signs (Exam refax patient's exam after returning to baseline) and nursing note reviewed.  Constitutional:      Appearance: He is well-developed.  HENT:     Head: Normocephalic.     Comments: 2 cm laceration over left eyebrow, periorbital ecchymosis with swelling of the left zygomatic arch.  However, he does not have pain to palpation in adjacent bone. Eyes:     Conjunctiva/sclera: Conjunctivae normal.  Neck:     Musculoskeletal: Neck supple.  Cardiovascular:     Rate and Rhythm: Normal rate and regular rhythm.     Heart sounds: No murmur.  Pulmonary:     Effort: Pulmonary effort is normal. No respiratory distress.     Breath sounds: Normal breath sounds.  Abdominal:     Palpations: Abdomen is soft.     Tenderness: There is no abdominal tenderness.  Skin:    General: Skin is warm and dry.  Neurological:  Mental Status: He is alert and oriented to person, place, and time.  Psychiatric:        Mood and Affect: Mood normal.      ED Treatments / Results  Labs (all labs ordered are listed, but only abnormal results are displayed) Labs Reviewed  CBG MONITORING, ED - Abnormal; Notable for the following components:      Result Value   Glucose-Capillary 145 (*)    All other components within normal limits    EKG None  Radiology Dg Shoulder Left  Result Date: 09/22/2018 CLINICAL DATA:  Anterior left shoulder pain, recurrent dislocation EXAM: LEFT SHOULDER - 2+ VIEW COMPARISON:  08/17/2018 FINDINGS: There is anterior dislocation of the left glenohumeral joint with  Hill-Sachs deformity of the left humeral head. There is a tiny fracture fragment adjacent to the anterior inferior bony glenoid on scapular Y-view consistent with bony Bankart fracture. IMPRESSION: There is anterior dislocation of the left glenohumeral joint with Hill-Sachs deformity of the left humeral head. There is a tiny fracture fragment adjacent to the anterior inferior bony glenoid on scapular Y-view consistent with bony Bankart fracture. Electronically Signed   By: Lauralyn PrimesAlex  Bibbey M.D.   On: 09/22/2018 16:49   Dg Shoulder Left Portable  Result Date: 09/22/2018 CLINICAL DATA:  Post reduction left shoulder EXAM: LEFT SHOULDER - 1 VIEW COMPARISON:  09/22/2018 FINDINGS: There appears to be normal alignment. Large Hill-Sachs defect noted in the superolateral head. IMPRESSION: Interval reduction.  Hill-Sachs deformity. Electronically Signed   By: Charlett NoseKevin  Dover M.D.   On: 09/22/2018 23:33   Dg Shoulder Left Portable  Result Date: 09/22/2018 CLINICAL DATA:  Seizure. Left shoulder pain. Status post reduction of dislocation. EXAM: LEFT SHOULDER - 1 VIEW COMPARISON:  Earlier films, same date. FINDINGS: Interval reduction of the shoulder dislocation. However, the axillary view demonstrates persistent anterior subluxation of the humeral head compared to the glenoid fossa. There is also a fracture fragment near the greater tuberosity. IMPRESSION: Reduction of the shoulder dislocation but there still anterior subluxation of the humeral head. Avulsion fracture near the greater tuberosity. Electronically Signed   By: Rudie MeyerP.  Gallerani M.D.   On: 09/22/2018 20:20    Procedures .Marland Kitchen.Laceration Repair  Date/Time: 09/23/2018 2:36 AM Performed by: Jaclynn MajorStarnes, Jason Acton, Jason Davenport Authorized by: Melene PlanFloyd, Dan, Jason Davenport   Consent:    Consent obtained:  Verbal   Consent given by:  Patient   Risks discussed:  Infection, need for additional repair, pain, poor cosmetic result and poor wound healing Anesthesia (see MAR for exact dosages):     Anesthesia method:  Local infiltration   Local anesthetic:  Lidocaine 2% WITH epi Laceration details:    Location:  Face   Face location:  L eyebrow   Length (cm):  2 Repair type:    Repair type:  Simple Pre-procedure details:    Preparation:  Patient was prepped and draped in usual sterile fashion Exploration:    Hemostasis achieved with:  Direct pressure   Wound exploration: wound explored through full range of motion and entire depth of wound probed and visualized     Wound extent: fascia violated and muscle damage     Contaminated: no   Treatment:    Area cleansed with:  Betadine   Amount of cleaning:  Standard   Irrigation solution:  Tap water   Irrigation volume:  300   Irrigation method:  Syringe   Visualized foreign bodies/material removed: no   Skin repair:    Repair method:  Sutures  Suture size:  5-0   Suture material:  Nylon   Suture technique:  Simple interrupted   Number of sutures:  5 Approximation:    Approximation:  Close Post-procedure details:    Dressing:  Open (no dressing)   Patient tolerance of procedure:  Tolerated well, no immediate complications Injection of joint  Date/Time: 09/23/2018 2:38 AM Performed by: Jaclynn MajorStarnes, Jason Clos, Jason Davenport Authorized by: Melene PlanFloyd, Dan, Jason Davenport  Consent: Verbal consent obtained. Risks and benefits: risks, benefits and alternatives were discussed Consent given by: patient Patient understanding: patient states understanding of the procedure being performed Patient identity confirmed: verbally with patient Time out: Immediately prior to procedure a "time out" was called to verify the correct patient, procedure, equipment, support staff and site/side marked as required. Preparation: Patient was prepped and draped in the usual sterile fashion. Local anesthesia used: yes Anesthesia: local infiltration  Anesthesia: Local anesthesia used: yes Local Anesthetic: lidocaine 2% with epinephrine Anesthetic total: 20 mL  Sedation: Patient  sedated: no  Patient tolerance: patient tolerated the procedure well with no immediate complications  Reduction of dislocation  Date/Time: 09/23/2018 2:39 AM Performed by: Jaclynn MajorStarnes, Jason Malter, Jason Davenport Authorized by: Melene PlanFloyd, Dan, Jason Davenport  Consent: Verbal consent obtained. Risks and benefits: risks, benefits and alternatives were discussed Consent given by: patient Patient understanding: patient states understanding of the procedure being performed Patient consent: the patient's understanding of the procedure matches consent given Imaging studies: imaging studies available Patient identity confirmed: verbally with patient and arm band Comments: Initially the patient was postictal upon arrival with a clear left shoulder dislocation.  Attempted reduction by the [employer] methods with partial reduction.  Then allowed patient to return to baseline mentation.  He then moved his arm and had increase of pain and upon palpation it had redislocated.  Administered intra-articular block but patient was unable to tolerate further manipulation.  Gave fentanyl as well and reattempted, again with partial relocation.  Ultimately sedated with propofol and was successful.  Redemonstrated Hill-Sachs deformity but patient had significant improvement in pain and range of motion.  Placed in sling and instructed to follow-up through his PCP for Ortho referral.     Medications Ordered in ED Medications  levETIRAcetam (KEPPRA) IVPB 1000 mg/100 mL premix (0 mg Intravenous Stopped 09/22/18 1815)  Tdap (BOOSTRIX) injection 0.5 mL (0.5 mLs Intramuscular Given 09/22/18 2100)  lidocaine-EPINEPHrine (XYLOCAINE W/EPI) 2 %-1:200000 (PF) injection 5 mL (5 mLs Intradermal Given 09/22/18 2106)  LORazepam (ATIVAN) injection 2 mg (2 mg Intravenous Given 09/22/18 1650)  lidocaine-EPINEPHrine (XYLOCAINE W/EPI) 2 %-1:200000 (PF) injection 20 mL (20 mLs Other Given by Other 09/22/18 2105)  propofol (DIPRIVAN) 10 mg/mL bolus/IV push 200 mg (200 mg  Intravenous Given 09/22/18 2304)  fentaNYL (SUBLIMAZE) injection 100 mcg (100 mcg Intravenous Given 09/22/18 2222)     Initial Impression / Assessment and Plan / ED Course  I have reviewed the triage vital signs and the nursing notes.  Pertinent labs & imaging results that were available during my care of the patient were reviewed by me and considered in my medical decision making (see chart for details).        Jason Davenport is a 28 year old male with a known seizure disorder.  He actually presented last month with the same presentation, arriving with shoulder dislocation and ultimately having a seizure.  Today was his third seizure.  He had extensive work-up on his last presentation including MRI and EEG.  Ultimately discharged on Keppra.  On arrival today he seized in the waiting  room and on my initial exam was confused, somnolent, consistent with postictal state.  Point-of-care glucose was normal.  He returned to baseline over the following half an hour as would be expected.  As noted, we did attempt reduction of the shoulder while he was still somnolent in an effort to avoid analgesia and potential need for sedation.  Ultimately the patient did require multiple attempts for reduction including with propofol which was performed by Dr. Adela LankFloyd.  After recovery from sedation he was placed in sling and instructed to follow-up with PCP regarding his seizures and receive follow-up for potential orthopedics evaluation and further therapy of his shoulder.  Facial laceration repair performed above.  Instructed to follow-up with PCP in 1 week for suture removal.  Gave standard aftercare instructions and return precautions regarding wound.  He has significant left face swelling but no tenderness adjacent to zygomatic arch or temporal bone.  I Jason Davenport not think that face or head CT are indicated due to this and the fact that he is returned completely to baseline.  Okay for discharge.  Patient vocalized understanding  and agreement with plan. Hand no other questions or concerns. Was given relevant verbal and written information regarding aftercare and return precautions and discharged in good condition.    Final Clinical Impressions(s) / ED Diagnoses   Final diagnoses:  Anterior dislocation of left shoulder, initial encounter  Seizure [facility](HCC)  Facial laceration, initial encounter    ED Discharge Orders    None       Jaclynn MajorStarnes, Ewin Rehberg, Jason Davenport 09/23/18 0247    Melene PlanFloyd, Dan, Jason Davenport 09/23/18 1857

## 2018-09-22 NOTE — ED Triage Notes (Signed)
Pt c/o left shoulder dislocation. Hx of the same.

## 2018-09-22 NOTE — ED Notes (Signed)
Pt uncle that lives in [place] is coming to see pt. Just spoke with him on the phone

## 2018-09-23 NOTE — ED Provider Notes (Signed)
.  Sedation  Date/Time: 09/23/2018 6:54 PM Performed by: Deno Etienne, DO Authorized by: Deno Etienne, DO   Consent:    Consent obtained:  Verbal   Consent given by:  Patient   Risks discussed:  Allergic reaction, prolonged hypoxia resulting in organ damage, prolonged sedation necessitating reversal, respiratory compromise necessitating ventilatory assistance and intubation, vomiting, nausea and inadequate sedation   Alternatives discussed:  Analgesia without sedation, anxiolysis and regional anesthesia Universal protocol:    Immediately prior to procedure a time out was called: yes     Patient identity confirmation method:  Arm band and verbally with patient Indications:    Procedure performed:  Dislocation reduction   Procedure necessitating sedation performed by:  Different physician Pre-sedation assessment:    Time since last food or drink:  6   ASA classification: class 1 - normal, healthy patient     Neck mobility: normal     Mouth opening:  2 finger widths   Thyromental distance:  4 finger widths   Mallampati score:  I - soft palate, uvula, fauces, pillars visible   Pre-sedation assessments completed and reviewed: airway patency, cardiovascular function, hydration status, mental status, nausea/vomiting, pain level, respiratory function and temperature   Immediate pre-procedure details:    Reassessment: Patient reassessed immediately prior to procedure     Reviewed: vital signs, relevant labs/tests and NPO status     Verified: bag valve mask available, emergency equipment available, intubation equipment available, IV patency confirmed, oxygen available and suction available   Procedure details (see MAR for exact dosages):    Preoxygenation:  Nasal cannula   Sedation:  Propofol   Analgesia:  Fentanyl   Intra-procedure monitoring:  Blood pressure monitoring, cardiac monitor, continuous pulse oximetry, frequent LOC assessments and frequent vital sign checks   Intra-procedure events: none      Total Provider sedation time (minutes):  35 Post-procedure details:    Post-sedation assessments completed and reviewed: airway patency, cardiovascular function, hydration status, mental status, nausea/vomiting, pain level, respiratory function and temperature     Patient is stable for discharge or admission: yes     Patient tolerance:  Tolerated well, no immediate complications Comments:     Required 170mg  of propofol to obtain adequate sedation      Deno Etienne, DO 09/23/18 1025

## 2018-09-23 NOTE — ED Notes (Signed)
Pt ambulated to restroom with no issues.  

## 2018-09-29 ENCOUNTER — Ambulatory Visit (INDEPENDENT_AMBULATORY_CARE_PROVIDER_SITE_OTHER): Payer: Self-pay | Admitting: Neurology

## 2018-09-29 ENCOUNTER — Other Ambulatory Visit: Payer: Self-pay

## 2018-09-29 ENCOUNTER — Encounter: Payer: Self-pay | Admitting: Neurology

## 2018-09-29 VITALS — BP 122/62 | HR 60 | Temp 97.8°F | Ht 71.0 in | Wt 150.0 lb

## 2018-09-29 DIAGNOSIS — R569 Unspecified convulsions: Secondary | ICD-10-CM | POA: Insufficient documentation

## 2018-09-29 DIAGNOSIS — S43015A Anterior dislocation of left humerus, initial encounter: Secondary | ICD-10-CM | POA: Insufficient documentation

## 2018-09-29 DIAGNOSIS — S43015S Anterior dislocation of left humerus, sequela: Secondary | ICD-10-CM

## 2018-09-29 MED ORDER — LEVETIRACETAM ER 500 MG PO TB24: 1000.0000 mg | ORAL_TABLET | Freq: Every day | ORAL | 4 refills | Status: DC

## 2018-09-29 NOTE — Progress Notes (Signed)
PATIENT: Jason Davenport DOB: 08/27/1990  Chief Complaint  Patient presents with  . Seizures    Reports having his first in December 2019.  Says his girlfriend witnessed him shaking, falling out of the bed and becoming stiff.  This occurred while he was sleeping.  Reports two further similar events.  Most recently, on 09/07/2018 when he went to the ED.  He was started on Keppra 500mg  BID.  No further seizures since being on medication.   Marland Kitchen. PCP    No PCP.  Referred from hospital.     HISTORICAL  Jason HoyleMontez Davenport is a 28 year old male, seen in request by emergency room for evaluation of seizure, initial evaluation was on September 29, 2018.  I have reviewed and summarized the referring note from the referring physician.  First seizure was on January 30, 2018, he had generalized tonic-clonic seizure, witnessed by his girlfriend, he fell off the bed during the seizure, suffered left shoulder dislocation  Second seizure was on August 17, 2018, he had a few spontaneous dislocation of left shoulder since the initial injury, on July 1 he was treated waiting at emergency room for dislocated left shoulder, had a witnessed generalized tonic-clonic seizure  Third seizure September 22, 2018, he again presented to the emergency room for treatment of his left shoulder dislocation, had a seizure in the emergency room, he was treated with Keppra 500 mg twice a day since  I personally reviewed CT head, MRI of the brain with without contrast on August 17, 2018, that was normal, small left frontal scalp contusion  Laboratory evaluations UDS was positive for opiates, and marijuana, CBC showed elevated WBC of 15.2, hemoglobin of 17.7, CMP showed glucose of 217,  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  ALLERGIES: No Known  Allergies  HOME MEDICATIONS: Current Outpatient Medications  Medication Sig Dispense Refill  . levETIRAcetam (KEPPRA) 500 MG tablet Take 1 tablet (500 mg total) by mouth 2 (two) times daily. 60 tablet 2   No current facility-administered medications for this visit.     PAST MEDICAL HISTORY: Past Medical History:  Diagnosis Date  . Seizure (HCC)     PAST SURGICAL HISTORY: Past Surgical History:  Procedure Laterality Date  . no past surgeries      FAMILY HISTORY: Family History  Problem Relation Age of Onset  . Hypertension Mother   . Hypertension Father     SOCIAL HISTORY: Social History   Socioeconomic History  . Marital status: Single    Spouse name: Not on file  . Number of children: 1  . Years of education: some college  . Highest education level: Not on file  Occupational History  . Occupation: Not working right now  Social Needs  . Financial resource strain: Not on file  . Food insecurity    Worry: Not on file    Inability: Not on file  . Transportation needs    Medical: Not on file    Non-medical: Not on file  Tobacco Use  . Smoking status: Current Every Day Smoker    Packs/day: 0.25    Types: Cigarettes  . Smokeless tobacco: Never Used  Substance and Sexual Activity  . Alcohol use: No  . Drug use: No  . Sexual activity: Not on file  Lifestyle  . Physical activity  Days per week: Not on file    Minutes per session: Not on file  . Stress: Not on file  Relationships  . Social Herbalist on phone: Not on file    Gets together: Not on file    Attends religious service: Not on file    Active member of club or organization: Not on file    Attends meetings of clubs or organizations: Not on file    Relationship status: Not on file  . Intimate partner violence    Fear of current or ex partner: Not on file    Emotionally abused: Not on file    Physically abused: Not on file    Forced sexual activity: Not on file  Other Topics Concern   . Not on file  Social History Narrative   Right-handed.   3-4 cups caffeine per day.   Lives at home alone.     PHYSICAL EXAM   Vitals:   09/29/18 1356  BP: 122/62  Pulse: 60  Temp: 97.8 F (36.6 C)  Weight: 150 lb (68 kg)  Height: 5\' 11"  (1.803 m)    Not recorded      Body mass index is 20.92 kg/m.  PHYSICAL EXAMNIATION:  Gen: NAD, conversant, well nourised, obese, well groomed                     Cardiovascular: Regular rate rhythm, no peripheral edema, warm, nontender. Eyes: Conjunctivae clear without exudates or hemorrhage Neck: Supple, no carotid bruits. Pulmonary: Clear to auscultation bilaterally   NEUROLOGICAL EXAM:  MENTAL STATUS: Speech:    Speech is normal; fluent and spontaneous with normal comprehension.  Cognition:     Orientation to time, place and person     Normal recent and remote memory     Normal Attention span and concentration     Normal Language, naming, repeating,spontaneous speech     Fund of knowledge   CRANIAL NERVES: CN II: Visual fields are full to confrontation.  pupils are round equal and briskly reactive to light. CN III, IV, VI: extraocular movement are normal. No ptosis. CN V: Facial sensation is intact to pinprick in all 3 divisions bilaterally. Corneal responses are intact.  CN VII: Face is symmetric with normal eye closure and smile. CN VIII: Hearing is normal to rubbing fingers CN IX, X: Palate elevates symmetrically. Phonation is normal. CN XI: Head turning and shoulder shrug are intact CN XII: Tongue is midline with normal movements and no atrophy.  MOTOR: Limited range of motion of left shoulder  REFLEXES: Reflexes are 2+ and symmetric at the biceps, triceps, knees, and ankles. Plantar responses are flexor.  SENSORY: Intact to light touch, pinprick, positional sensation and vibratory sensation are intact in fingers and toes.  COORDINATION: Rapid alternating movements and fine finger movements are intact. There  is no dysmetria on finger-to-nose and heel-knee-shin.    GAIT/STANCE: Posture is normal. Gait is steady with normal steps, base, arm swing, and turning. Heel and toe walking are normal. Tandem gait is normal.  Romberg is absent.   DIAGNOSTIC DATA (LABS, IMAGING, TESTING) - I reviewed patient records, labs, notes, testing and imaging myself where available.   ASSESSMENT AND PLAN  Jerson Furukawa is a 28 y.o. male   Recurrence generalized tonic-clonic seizure,  Most recent one was on September 22, 2018  Normal MRI of the brain with without contrast  EEG  Continue Keppra XR  500 mg 2 tablets at night  No driving until seizure-free for 6 months   Levert FeinsteinYijun Vung Kush, M.D. Ph.D.  [facility] [address], [address] [place], [place] [address] Ph: [phone] Fax: [phone]  CC: Referring Provider

## 2018-10-01 ENCOUNTER — Other Ambulatory Visit: Payer: Self-pay

## 2018-10-01 ENCOUNTER — Ambulatory Visit (HOSPITAL_COMMUNITY)
Admission: EM | Admit: 2018-10-01 | Discharge: 2018-10-01 | Disposition: A | Discharge status: Home / Self Care | Payer: Self-pay

## 2018-10-01 DIAGNOSIS — Z4802 Encounter for removal of sutures: Secondary | ICD-10-CM

## 2018-10-01 NOTE — ED Triage Notes (Signed)
Pt is came in to have 5 suture removed from above his left eye located in eyebrow.

## 2018-10-06 ENCOUNTER — Ambulatory Visit: Payer: Self-pay | Admitting: Orthopaedic Surgery

## 2018-10-11 ENCOUNTER — Ambulatory Visit: Payer: Self-pay | Admitting: Orthopaedic Surgery

## 2018-10-17 ENCOUNTER — Encounter: Payer: Self-pay | Admitting: Neurology

## 2018-10-17 ENCOUNTER — Other Ambulatory Visit: Payer: Self-pay

## 2018-10-23 ENCOUNTER — Other Ambulatory Visit: Payer: Self-pay

## 2018-10-23 ENCOUNTER — Emergency Department (HOSPITAL_COMMUNITY): Payer: Self-pay

## 2018-10-23 ENCOUNTER — Emergency Department (HOSPITAL_COMMUNITY)
Admission: EM | Admit: 2018-10-23 | Discharge: 2018-10-23 | Disposition: A | Discharge status: Home / Self Care | Payer: Self-pay | Attending: Emergency Medicine | Admitting: Emergency Medicine

## 2018-10-23 ENCOUNTER — Encounter (HOSPITAL_COMMUNITY): Payer: Self-pay | Admitting: Emergency Medicine

## 2018-10-23 DIAGNOSIS — S43005A Unspecified dislocation of left shoulder joint, initial encounter: Secondary | ICD-10-CM | POA: Insufficient documentation

## 2018-10-23 DIAGNOSIS — Z79899 Other long term (current) drug therapy: Secondary | ICD-10-CM | POA: Insufficient documentation

## 2018-10-23 DIAGNOSIS — M24412 Recurrent dislocation, left shoulder: Secondary | ICD-10-CM | POA: Insufficient documentation

## 2018-10-23 DIAGNOSIS — Y92032 Bedroom in apartment as the place of occurrence of the external cause: Secondary | ICD-10-CM | POA: Insufficient documentation

## 2018-10-23 DIAGNOSIS — F17210 Nicotine dependence, cigarettes, uncomplicated: Secondary | ICD-10-CM | POA: Insufficient documentation

## 2018-10-23 DIAGNOSIS — Y999 Unspecified external cause status: Secondary | ICD-10-CM | POA: Insufficient documentation

## 2018-10-23 DIAGNOSIS — X58XXXA Exposure to other specified factors, initial encounter: Secondary | ICD-10-CM | POA: Insufficient documentation

## 2018-10-23 DIAGNOSIS — Y9384 Activity, sleeping: Secondary | ICD-10-CM | POA: Insufficient documentation

## 2018-10-23 MED ORDER — PROPOFOL 10 MG/ML IV BOLUS
170.0000 mg | Freq: Once | INTRAVENOUS | Status: DC
  Filled 2018-10-23: qty 20

## 2018-10-23 MED ORDER — HYDROMORPHONE HCL 1 MG/ML IJ SOLN
1.0000 mg | Freq: Once | INTRAMUSCULAR | Status: AC
  Administered 2018-10-23: 1 mg via INTRAVENOUS
  Filled 2018-10-23: qty 1

## 2018-10-23 MED ORDER — PROPOFOL 10 MG/ML IV BOLUS
INTRAVENOUS | Status: AC | PRN
  Administered 2018-10-23: 100 mg via INTRAVENOUS
  Administered 2018-10-23: 200 mg via INTRAVENOUS

## 2018-10-23 MED ORDER — FENTANYL CITRATE (PF) 100 MCG/2ML IJ SOLN
INTRAMUSCULAR | Status: AC | PRN
  Administered 2018-10-23: 50 ug via INTRAVENOUS

## 2018-10-23 MED ORDER — LIDOCAINE-EPINEPHRINE (PF) 2 %-1:200000 IJ SOLN
20.0000 mL | Freq: Once | INTRAMUSCULAR | Status: AC
  Administered 2018-10-23: 20 mL
  Filled 2018-10-23: qty 20

## 2018-10-23 MED ORDER — PROPOFOL 10 MG/ML IV BOLUS
INTRAVENOUS | Status: AC | PRN
  Administered 2018-10-23: 200 mg via INTRAVENOUS

## 2018-10-23 MED ORDER — FENTANYL CITRATE (PF) 100 MCG/2ML IJ SOLN
INTRAMUSCULAR | Status: AC | PRN
  Administered 2018-10-23: 25 ug via INTRAVENOUS

## 2018-10-23 MED ORDER — PROPOFOL 10 MG/ML IV BOLUS
INTRAVENOUS | Status: AC
  Filled 2018-10-23: qty 20

## 2018-10-23 MED ORDER — FENTANYL CITRATE (PF) 100 MCG/2ML IJ SOLN: 25.0000 ug | Freq: Once | INTRAMUSCULAR | Status: DC

## 2018-10-23 MED ORDER — PROPOFOL 10 MG/ML IV BOLUS
INTRAVENOUS | Status: AC | PRN
  Administered 2018-10-23: 180 mg via INTRAVENOUS

## 2018-10-23 MED ORDER — PROPOFOL 10 MG/ML IV BOLUS
180.0000 mg | Freq: Once | INTRAVENOUS | Status: DC
  Filled 2018-10-23 (×2): qty 20

## 2018-10-23 MED ORDER — HYDROCODONE-ACETAMINOPHEN 5-325 MG PO TABS: 1.0000 | ORAL_TABLET | Freq: Four times a day (QID) | ORAL | 0 refills | Status: DC | PRN

## 2018-10-23 MED ORDER — FENTANYL CITRATE (PF) 100 MCG/2ML IJ SOLN
100.0000 ug | Freq: Once | INTRAMUSCULAR | Status: DC
  Filled 2018-10-23: qty 2

## 2018-10-23 MED ORDER — ONDANSETRON HCL 4 MG/2ML IJ SOLN
4.0000 mg | Freq: Once | INTRAMUSCULAR | Status: AC
  Administered 2018-10-23: 4 mg via INTRAVENOUS
  Filled 2018-10-23: qty 2

## 2018-10-23 NOTE — ED Provider Notes (Signed)
[facility] EMERGENCY DEPARTMENT Provider Note   CSN: [id number] Arrival date & time: 10/23/18  1120     History   Chief Complaint Chief Complaint  Patient presents with  . Shoulder Pain    HPI Jason Davenport is a 28 y.o. male with PMH/o Seizures, shoulder dislocation who presents for evaluation of left shoulder pain that began this morning.  He states when he woke up at about 11 AM, he had pain in his left shoulder and could not move it.  He states that he thinks he might have slept on it wrong.  He does not recall any trauma, injury, fall.  He has a history of shoulder dislocation previously on 09/22/18.  He was successfully reduced in the ED and discharged home.  He states he never followed up with orthopedics.  He denies any numbness/weakness.  His last p.o. was yesterday at lunch.     The history is provided by the patient.    Past Medical History:  Diagnosis Date  . Seizure [facility])     Patient Active Problem List   Diagnosis Date Noted  . Seizures (HCC) 09/29/2018  . Anterior dislocation of left shoulder 09/29/2018  . Seizure (HCC) 08/17/2018  . Anterior shoulder dislocation, left, initial encounter 08/17/2018    Past Surgical History:  Procedure Laterality Date  . no past surgeries          Home Medications    Prior to Admission medications   Medication Sig Start Date End Date Taking? Authorizing Provider  HYDROcodone-acetaminophen (NORCO/VICODIN) 5-325 MG tablet Take 1-2 tablets by mouth every 6 (six) hours as needed. 10/23/18   Maxwell Caul, PA-C  levETIRAcetam (KEPPRA XR) 500 MG 24 hr tablet Take 2 tablets (1,000 mg total) by mouth at bedtime. 09/29/18   Levert Feinstein, MD  levETIRAcetam (KEPPRA) 500 MG tablet Take 1 tablet (500 mg total) by mouth 2 (two) times daily. 08/18/18   Joseph Art, DO    Family History Family History  Problem Relation Age of Onset  . Hypertension Mother   . Hypertension Father     Social History Social  History   Tobacco Use  . Smoking status: Current Every Day Smoker    Packs/day: 0.25    Types: Cigarettes  . Smokeless tobacco: Never Used  Substance Use Topics  . Alcohol use: No  . Drug use: Yes    Types: Marijuana     Allergies   Patient has no known allergies.   Review of Systems Review of Systems  Musculoskeletal:       Left shoulder pain  Neurological: Negative for weakness and numbness.  All other systems reviewed and are negative.    Physical Exam Updated Vital Signs BP 110/71   Pulse 71   Temp (!) 97.3 F (36.3 C)   Resp 20   SpO2 100%   Physical Exam Vitals signs and nursing note reviewed.  Constitutional:      Appearance: He is well-developed.  HENT:     Head: Normocephalic and atraumatic.  Eyes:     General: No scleral icterus.       Right eye: No discharge.        Left eye: No discharge.     Conjunctiva/sclera: Conjunctivae normal.  Cardiovascular:     Pulses:          Radial pulses are 2+ on the right side and 2+ on the left side.  Pulmonary:     Effort: Pulmonary effort is  normal.  Musculoskeletal:     Comments: Tenderness palpation noted to left shoulder with deformity noted.  Limited range of motion secondary to pain.  No bony tenderness noted to left elbow, left wrist.  He can move all 5 digits of left hand with any difficulty.  No tenderness palpation of the right upper extremity.  Full range of motion of right upper extremity without any difficulty.  Skin:    General: Skin is warm and dry.     Comments: Good distal cap refill.  LUE is not dusky in appearance or cool to touch.  Neurological:     Mental Status: He is alert.  Psychiatric:        Speech: Speech normal.        Behavior: Behavior normal.      ED Treatments / Results  Labs (all labs ordered are listed, but only abnormal results are displayed) Labs Reviewed - No data to display  EKG None  Radiology Dg Shoulder Left  Result Date: 10/23/2018 CLINICAL DATA:   28 year old male with history of left shoulder dislocation presenting with left shoulder pain. EXAM: LEFT SHOULDER - 2+ VIEW COMPARISON:  Earlier left shoulder radiograph dated 10/23/2018 FINDINGS: There is anterior dislocation of the left shoulder. A Hill-Sachs deformity may be present on the superolateral humeral head but better seen on the prior radiograph. The soft tissues are grossly unremarkable. IMPRESSION: Anterior dislocation of the left shoulder. Electronically Signed   By: Elgie CollardArash  Radparvar M.D.   On: 10/23/2018 18:22   Dg Shoulder Left  Result Date: 10/23/2018 CLINICAL DATA:  History of multiple shoulder dislocations. Sloughed on shoulder wrong and woke up with dislocation EXAM: LEFT SHOULDER - 2+ VIEW COMPARISON:  09/22/2018 FINDINGS: There is anterior inferior left shoulder dislocation. 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 No acute fracture identified at this time. IMPRESSION: 1. Anterior and inferior left shoulder dislocation. Electronically Signed   By: Signa Kellaylor  Stroud M.D.   On: 10/23/2018 12:51   Dg Shoulder Left Portable  Result Date: 10/23/2018 CLINICAL DATA:  Post reduction. EXAM: LEFT SHOULDER - 1 VIEW COMPARISON:  Earlier the same date. FINDINGS: Interval relocation of the left shoulder with normal alignment of the glenohumeral joint. Large Hill-Sachs deformity of the posterior humeral head, similar to the radiograph dated September 22, 2018. IMPRESSION: 1. Interval relocation of the left shoulder. 2. Large Hill-Sachs deformity of the posterior humeral head, unchanged from September 22, 2018. Electronically Signed   By: Ted Mcalpineobrinka  Dimitrova M.D.   On: 10/23/2018 16:42    Procedures Reduction of dislocation  Date/Time: 10/23/2018 5:02 PM Performed by: Maxwell CaulLayden, Sacha Radloff A, PA-C Authorized by: Maxwell CaulLayden, Alfonzo Arca A, PA-C  Consent: Verbal consent obtained. Written consent obtained. Consent given by: patient Patient understanding: patient states understanding of the procedure being performed  Patient consent: the patient's understanding of the procedure matches consent given Procedure consent: procedure consent matches procedure scheduled Relevant documents: relevant documents present and verified Test results: test results available and properly labeled Site marked: the operative site was marked Imaging studies: imaging studies available Patient identity confirmed: verbally with patient Time out: Immediately prior to procedure a "time out" was called to verify the correct patient, procedure, equipment, support staff and site/side marked as required. Patient tolerance: patient tolerated the procedure well with no immediate complications Comments: Please see note from attending physician regarding sedation note.  We attempted various methods, including fears, traction/countertraction but no difficulty.  He was re-sedated with additional medications and after multiple tries, we were able to  get back in.    (including critical care time)  Medications Ordered in ED Medications  propofol (DIPRIVAN) 10 mg/mL bolus/IV push 180 mg (has no administration in time range)  fentaNYL (SUBLIMAZE) injection 25 mcg (has no administration in time range)  propofol (DIPRIVAN) 10 mg/mL bolus/IV push (has no administration in time range)  propofol (DIPRIVAN) 10 mg/mL bolus/IV push (has no administration in time range)  ondansetron (ZOFRAN) injection 4 mg (4 mg Intravenous Given 10/23/18 1426)  fentaNYL (SUBLIMAZE) injection (25 mcg Intravenous Given 10/23/18 1528)  propofol (DIPRIVAN) 10 mg/mL bolus/IV push (200 mg Intravenous Given 10/23/18 1529)  propofol (DIPRIVAN) 10 mg/mL bolus/IV push (180 mg Intravenous Given 10/23/18 1553)  fentaNYL (SUBLIMAZE) injection (50 mcg Intravenous Given 10/23/18 1540)  lidocaine-EPINEPHrine (XYLOCAINE W/EPI) 2 %-1:200000 (PF) injection 20 mL (20 mLs Other Given by Other 10/23/18 1600)  propofol (DIPRIVAN) 10 mg/mL bolus/IV push (200 mg Intravenous Given 10/23/18 1603)  propofol  (DIPRIVAN) 10 mg/mL bolus/IV push (200 mg Intravenous Canceled Entry 10/23/18 1607)  HYDROmorphone (DILAUDID) injection 1 mg (1 mg Intravenous Given 10/23/18 1904)     Initial Impression / Assessment and Plan / ED Course  I have reviewed the triage vital signs and the nursing notes.  Pertinent labs & imaging results that were available during my care of the patient were reviewed by me and considered in my medical decision making (see chart for details).        28 year old male who presents for evaluation of left shoulder pain that began today.  History of shoulder dislocations.  Never followed up with Ortho.  On initial ED arrival, he is afebrile, appears uncomfortable but nontoxic-appearing.  Vital signs are stable.  He is neurovascularly intact.  He has tenderness and deformity noted to left shoulder.  Concern for dislocation.  Low suspicion for fracture but also consideration.  X-ray reviewed.  He has an anterior and inferior shoulder dislocation.  Reduction as documented above.  Patient tolerated procedure well.  Repeat x-rays show successful reduction.  He does have Hill-Sachs deformity noted.  Discussed results with patient.  He is alert and oriented and is answering questions appropriately.  Instructed him to follow-up with orthopedics. At this time, patient exhibits no emergent life-threatening condition that require further evaluation in ED or admission. Patient had ample opportunity for questions and discussion. All patient's questions were answered with full understanding. Strict return precautions discussed. Patient expresses understanding and agreement to plan.   Portions of this note were generated with Lobbyist. Dictation errors may occur despite best attempts at proofreading.   Final Clinical Impressions(s) / ED Diagnoses   Final diagnoses:  Dislocation of left shoulder joint, initial encounter    ED Discharge Orders         Ordered     HYDROcodone-acetaminophen (NORCO/VICODIN) 5-325 MG tablet  Every 6 hours PRN     10/23/18 1705           Desma Mcgregor 10/23/18 2135    Gareth Morgan, MD 10/26/18 0015

## 2018-10-23 NOTE — ED Notes (Signed)
Patient verbalizes understanding of discharge instructions. Opportunity for questioning and answers were provided. Armband removed by staff, pt discharged from ED.  

## 2018-10-23 NOTE — ED Provider Notes (Signed)
[facility] EMERGENCY DEPARTMENT Provider Note   CSN: [id number] Arrival date & time: 10/23/18  1120     History   Chief Complaint Chief Complaint  Patient presents with  . Shoulder Pain    HPI Jason Davenport is a 28 y.o. male has a history of seizure, shoulder dislocation who presents for evaluation of left shoulder dislocation.  Patient seen here in the ED earlier today by myself and Dr. Billy Fischer with dislocation that was reduced in the ED.  He was discharged home and reports that when he went out into the waiting room, he bent down to get his neck and his shoulder re-dislocated.  Denies any numbness/weakness.     The history is provided by the patient.    Past Medical History:  Diagnosis Date  . Seizure [facility])     Patient Active Problem List   Diagnosis Date Noted  . Seizures ([place]) 09/29/2018  . Anterior dislocation of left shoulder 09/29/2018  . Seizure ([place]) 08/17/2018  . Anterior shoulder dislocation, left, initial encounter 08/17/2018    Past Surgical History:  Procedure Laterality Date  . no past surgeries          Home Medications    Prior to Admission medications   Medication Sig Start Date End Date Taking? Authorizing Provider  HYDROcodone-acetaminophen (NORCO/VICODIN) 5-325 MG tablet Take 1-2 tablets by mouth every 6 (six) hours as needed. 10/23/18   Volanda Napoleon, PA-C  levETIRAcetam (KEPPRA XR) 500 MG 24 hr tablet Take 2 tablets (1,000 mg total) by mouth at bedtime. 09/29/18   Marcial Pacas, MD  levETIRAcetam (KEPPRA) 500 MG tablet Take 1 tablet (500 mg total) by mouth 2 (two) times daily. 08/18/18   Geradine Girt, DO    Family History Family History  Problem Relation Age of Onset  . Hypertension Mother   . Hypertension Father     Social History Social History   Tobacco Use  . Smoking status: Current Every Day Smoker    Packs/day: 0.25    Types: Cigarettes  . Smokeless tobacco: Never Used  Substance Use Topics  . Alcohol  use: No  . Drug use: Yes    Types: Marijuana     Allergies   Patient has no known allergies.   Review of Systems Review of Systems  Musculoskeletal:       Left shoulder pain  Neurological: Negative for weakness and numbness.  All other systems reviewed and are negative.    Physical Exam Updated Vital Signs BP 124/76 (BP Location: Right Arm)   Pulse 64   Temp (!) 97.3 F (36.3 C)   Resp 16   SpO2 100%   Physical Exam Vitals signs and nursing note reviewed.  Constitutional:      Appearance: He is well-developed.  HENT:     Head: Normocephalic and atraumatic.  Eyes:     General: No scleral icterus.       Right eye: No discharge.        Left eye: No discharge.     Conjunctiva/sclera: Conjunctivae normal.  Cardiovascular:     Pulses:          Radial pulses are 2+ on the right side and 2+ on the left side.  Pulmonary:     Effort: Pulmonary effort is normal.  Musculoskeletal:     Comments: Tenderness palpation noted to left shoulder with obvious deformity.  Limited range of motion secondary to pain.  No tenderness noted to left elbow, left wrist.  Full range of motion of right upper extremity with any difficulty.  Skin:    General: Skin is warm and dry.     Capillary Refill: Capillary refill takes less than 2 seconds.     Comments: Good distal cap refill. LUE is not dusky in appearance or cool to touch.  Neurological:     Mental Status: He is alert.     Comments: Sensation intact along major nerve distributions of BUE  Psychiatric:        Speech: Speech normal.        Behavior: Behavior normal.      ED Treatments / Results  Labs (all labs ordered are listed, but only abnormal results are displayed) Labs Reviewed - No data to display  EKG None  Radiology Dg Shoulder Left  Result Date: 10/23/2018 CLINICAL DATA:  [age] year old male with history of left shoulder dislocation presenting with left shoulder pain. EXAM: LEFT SHOULDER - 2+ VIEW COMPARISON:  Earlier  left shoulder radiograph dated 10/23/2018 FINDINGS: There is anterior dislocation of the left shoulder. A Hill-Sachs deformity may be present on the superolateral humeral head but better seen on the prior radiograph. The soft tissues are grossly unremarkable. IMPRESSION: Anterior dislocation of the left shoulder. Electronically Signed   By: Elgie CollardArash  Radparvar M.D.   On: 10/23/2018 18:22   Dg Shoulder Left  Result Date: 10/23/2018 CLINICAL DATA:  History of multiple shoulder dislocations. Sloughed on shoulder wrong and woke up with dislocation EXAM: LEFT SHOULDER - 2+ VIEW COMPARISON:  09/22/2018 FINDINGS: There is anterior inferior left shoulder dislocation. 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 No acute fracture identified at this time. IMPRESSION: 1. Anterior and inferior left shoulder dislocation. Electronically Signed   By: Signa Kellaylor  Stroud M.D.   On: 10/23/2018 12:51   Dg Shoulder Left Portable  Result Date: 10/23/2018 CLINICAL DATA:  Postreduction EXAM: LEFT SHOULDER - 1 VIEW COMPARISON:  Radiograph same day FINDINGS: There is been interval reduction of the anterior humeral head dislocation. There is a large Hill-Sachs deformity at the posterolateral corner of the humeral head. No other definite fracture is seen. IMPRESSION: Interval reduction of anterior dislocation with large Hill-Sachs deformity. Electronically Signed   By: Jonna ClarkBindu  Avutu M.D.   On: 10/23/2018 21:37   Dg Shoulder Left Portable  Result Date: 10/23/2018 CLINICAL DATA:  Post reduction. EXAM: LEFT SHOULDER - 1 VIEW COMPARISON:  Earlier the same date. FINDINGS: Interval relocation of the left shoulder with normal alignment of the glenohumeral joint. Large Hill-Sachs deformity of the posterior humeral head, similar to the radiograph dated September 22, 2018. IMPRESSION: 1. Interval relocation of the left shoulder. 2. Large Hill-Sachs deformity of the posterior humeral head, unchanged from September 22, 2018. Electronically Signed   By: Ted Mcalpineobrinka   Dimitrova M.D.   On: 10/23/2018 16:42    Procedures Reduction of dislocation  Date/Time: 10/23/2018 9:30 PM Performed by: Maxwell CaulLayden, Lindsey A, PA-C Authorized by: Maxwell CaulLayden, Lindsey A, PA-C  Consent: Verbal consent obtained. Consent given by: patient Patient understanding: patient states understanding of the procedure being performed Patient consent: the patient's understanding of the procedure matches consent given Procedure consent: procedure consent matches procedure scheduled Relevant documents: relevant documents present and verified Test results: test results available and properly labeled Site marked: the operative site was marked Imaging studies: imaging studies available Required items: required blood products, implants, devices, and special equipment available Patient identity confirmed: verbally with patient Time out: Immediately prior to procedure a "time out" was called to verify the correct patient,  procedure, equipment, support staff and site/side marked as required. Patient tolerance: patient tolerated the procedure well with no immediate complications Comments: Patient was sedated.  Please see separate attending sedation note.  Using traction/countertraction, the left shoulder was reduced and placed in a arm immobilizer.    (including critical care time)  Medications Ordered in ED Medications  propofol (DIPRIVAN) 10 mg/mL bolus/IV push 180 mg (has no administration in time range)  fentaNYL (SUBLIMAZE) injection 25 mcg (has no administration in time range)  propofol (DIPRIVAN) 10 mg/mL bolus/IV push (has no administration in time range)  propofol (DIPRIVAN) 10 mg/mL bolus/IV push (has no administration in time range)  propofol (DIPRIVAN) 10 mg/mL bolus/IV push 170 mg (has no administration in time range)  ondansetron (ZOFRAN) injection 4 mg (4 mg Intravenous Given 10/23/18 1426)  fentaNYL (SUBLIMAZE) injection (25 mcg Intravenous Given 10/23/18 1528)  propofol (DIPRIVAN) 10  mg/mL bolus/IV push (200 mg Intravenous Given 10/23/18 1529)  propofol (DIPRIVAN) 10 mg/mL bolus/IV push (180 mg Intravenous Given 10/23/18 1553)  fentaNYL (SUBLIMAZE) injection (50 mcg Intravenous Given 10/23/18 1540)  lidocaine-EPINEPHrine (XYLOCAINE W/EPI) 2 %-1:200000 (PF) injection 20 mL (20 mLs Other Given by Other 10/23/18 1600)  propofol (DIPRIVAN) 10 mg/mL bolus/IV push (200 mg Intravenous Given 10/23/18 1603)  propofol (DIPRIVAN) 10 mg/mL bolus/IV push (200 mg Intravenous Canceled Entry 10/23/18 1607)  HYDROmorphone (DILAUDID) injection 1 mg (1 mg Intravenous Given 10/23/18 1904)  propofol (DIPRIVAN) 10 mg/mL bolus/IV push (100 mg Intravenous Given 10/23/18 2107)     Initial Impression / Assessment and Plan / ED Course  I have reviewed the triage vital signs and the nursing notes.  Pertinent labs & imaging results that were available during my care of the patient were reviewed by me and considered in my medical decision making (see chart for details).         28 year old male who presents for evaluation of left shoulder pain.  Seen in the ED here in the ED earlier for shoulder dislocation which was reduced successfully.  He went to the waiting room and bent down to get a stack and states that it came out again.  On evaluation, he does have deformity noted to left shoulder.  Patient is afebrile, nontoxic appearing, sitting comfortably on bed.  Vital signs are stable.  He is neurovascularly intact.  Left shoulder with obvious deformity.  Concern for dislocation.  X-ray reviewed.  There is evidence of anterior shoulder dislocation.  Shoulder reduced as documented above.  Patient tolerated procedure well.  Postreduction film shows successful reduction.  Placement placed in a sling immobilizer.  Instructed patient to follow-up with orthopedics.  Reevaluation after procedure shows good radial pulses, cap refill of left upper extremity. At this time, patient exhibits no emergent life-threatening  condition that require further evaluation in ED or admission. Patient had ample opportunity for questions and discussion. All patient's questions were answered with full understanding. Strict return precautions discussed. Patient expresses understanding and agreement to plan.   Portions of this note were generated with Scientist, clinical (histocompatibility and immunogenetics). Dictation errors may occur despite best attempts at proofreading.  Final Clinical Impressions(s) / ED Diagnoses   Final diagnoses:  Dislocation of left shoulder joint, initial encounter    ED Discharge Orders         Ordered    HYDROcodone-acetaminophen (NORCO/VICODIN) 5-325 MG tablet  Every 6 hours PRN     10/23/18 1705           Maxwell Caul, PA-C 10/23/18 2207  Alvira MondaySchlossman, Erin, MD 10/26/18 [phone]

## 2018-10-23 NOTE — Progress Notes (Signed)
RT at bedside for conscious sedation.  Patient on 2L [place].  HR 72  RR 28  115/74  100% Spo2.  Tolerated procedure well.  Patient verbalizing.

## 2018-10-23 NOTE — Discharge Instructions (Signed)
You can take Tylenol or Ibuprofen as directed for pain. You can alternate Tylenol and Ibuprofen every 4 hours. If you take Tylenol at 1pm, then you can take Ibuprofen at 5pm. Then you can take Tylenol again at 9pm.   Take pain medications as directed for break through pain. Do not drive or operate machinery while taking this medication.   Wear the sling for support and stabilization.   Follow up with the referred orthopedic doctor.  Return to the Emergency Dept for any worsening pain, numbness/weakness or any other worsening or concerning symptoms.

## 2018-10-23 NOTE — ED Triage Notes (Signed)
Fell asleep wrong and now lwft shoulder hurts states has had this happen before but has not followed up  No new injury excet slept wrong

## 2018-10-23 NOTE — ED Provider Notes (Signed)
MSE was initiated and I personally evaluated the patient and placed orders (if any) at  12:31 PM on October 23, 2018.  The patient appears stable so that the remainder of the MSE may be completed by another provider.  Briefly, 28 year old male presents with left shoulder pain.  Pain started this morning when he woke up at 11 AM.  He states he has history of previous left shoulder dislocations., last was 09/23/2018. He denies any injury but thinks he might of slept wrong.  He states pain is 8 out of 10 in severity.  Patient has not followed up with PCP or orthopedics for recurrent dislocations.  X-ray of left shoulder has been ordered.  Patient will be moved out of fast track room as shoulder appears dislocated and will need reduction.  Strong radial pulses in bilateral upper extremities, patient is neurovascularly intact.  PE: Constitutional: well-developed, well-nourished, no apparent distress HENT: normocephalic, Cardiovascular: normal rate and rhythm, distal pulses intact Pulmonary/Chest: effort normal; breath sounds clear and equal bilaterally; no wheezes or rales Abdominal: soft and nontender Musculoskeletal: left shoulder with bony tenderness. Decreased ROM secondary to pain and possible dislocation. Neurological: alert with goal directed thinking Skin: warm and dry, no rash, no diaphoresis Psychiatric: normal mood and affect, normal behavior   Next provider will follow up on imaging and dispo patient accordingly   Cherre Robins, PA-C 10/23/18 1235    Pattricia Boss, MD 10/25/18 [phone]

## 2018-10-25 NOTE — ED Provider Notes (Signed)
  Physical Exam  BP 123/70   Pulse 68   Temp (!) 97.3 F (36.3 C)   Resp 16   SpO2 100%   Physical Exam  ED Course/Procedures     .Sedation  Date/Time: 10/25/2018 11:56 PM Performed by: Gareth Morgan, MD Authorized by: Gareth Morgan, MD   Consent:    Consent obtained:  Verbal   Consent given by:  Patient   Risks discussed:  Allergic reaction, dysrhythmia, inadequate sedation, nausea, prolonged hypoxia resulting in organ damage, prolonged sedation necessitating reversal, respiratory compromise necessitating ventilatory assistance and intubation and vomiting   Alternatives discussed:  Analgesia without sedation, anxiolysis and regional anesthesia Universal protocol:    Procedure explained and questions answered to patient or proxy's satisfaction: yes     Relevant documents present and verified: yes     Test results available and properly labeled: yes     Imaging studies available: yes     Required blood products, implants, devices, and special equipment available: yes     Site/side marked: yes     Immediately prior to procedure a time out was called: yes     Patient identity confirmation method:  Verbally with patient Indications:    Procedure performed:  Dislocation reduction   Procedure necessitating sedation performed by:  Physician performing sedation Pre-sedation assessment:    Time since last food or drink:  4   ASA classification: class 1 - normal, healthy patient     Neck mobility: normal     Mouth opening:  3 or more finger widths   Thyromental distance:  4 finger widths   Mallampati score:  I - soft palate, uvula, fauces, pillars visible   Pre-sedation assessments completed and reviewed: airway patency, cardiovascular function, hydration status, mental status, nausea/vomiting, pain level, respiratory function and temperature   Immediate pre-procedure details:    Reassessment: Patient reassessed immediately prior to procedure     Reviewed: vital signs,  relevant labs/tests and NPO status     Verified: bag valve mask available, emergency equipment available, intubation equipment available, IV patency confirmed, oxygen available and suction available   Procedure details (see MAR for exact dosages):    Preoxygenation:  Nasal cannula   Sedation:  Propofol   Intra-procedure monitoring:  Blood pressure monitoring, cardiac monitor, continuous pulse oximetry, frequent LOC assessments, frequent vital sign checks and continuous capnometry   Intra-procedure events: none     Total Provider sedation time (minutes):  45 Post-procedure details:    Attendance: Constant attendance by certified staff until patient recovered     Recovery: Patient returned to pre-procedure baseline     Post-sedation assessments completed and reviewed: airway patency, cardiovascular function, hydration status, mental status, nausea/vomiting, pain level, respiratory function and temperature     Patient is stable for discharge or admission: yes     Patient tolerance:  Tolerated well, no immediate complications Comments:     Large amount of propofol needed, initially inadequate sedation with attempted reduction, pt  requiring multiple doses of propofol.  Dr. Darl Householder came to bedside to assist with reduction after sedation continued    MDM  28yo male presents with concern for shoulder dislocation.  Reduction performed per [place] PA-C note . Difficult sedation given large amount of medication required also making reduction difficult.  Dr. Darl Householder to bedside to assist with reduction after attempts.  Discharged in stable condition.       Gareth Morgan, MD 10/26/18 [phone]

## 2018-10-26 NOTE — ED Provider Notes (Signed)
  Physical Exam  BP 123/70   Pulse 68   Temp (!) 97.3 F (36.3 C)   Resp 16   SpO2 100%   Physical Exam  ED Course/Procedures     .Sedation  Date/Time: 10/26/2018 12:15 AM Performed by: Gareth Morgan, MD Authorized by: Gareth Morgan, MD   Consent:    Consent obtained:  Verbal   Consent given by:  Patient   Risks discussed:  Allergic reaction, dysrhythmia, inadequate sedation, nausea, prolonged hypoxia resulting in organ damage, prolonged sedation necessitating reversal, respiratory compromise necessitating ventilatory assistance and intubation and vomiting   Alternatives discussed:  Analgesia without sedation, anxiolysis and regional anesthesia Universal protocol:    Procedure explained and questions answered to patient or proxy's satisfaction: yes     Relevant documents present and verified: yes     Test results available and properly labeled: yes     Imaging studies available: yes     Required blood products, implants, devices, and special equipment available: yes     Site/side marked: yes     Immediately prior to procedure a time out was called: yes     Patient identity confirmation method:  Verbally with patient Indications:    Procedure necessitating sedation performed by:  Physician performing sedation Pre-sedation assessment:    Time since last food or drink:  9   ASA classification: class 1 - normal, healthy patient     Neck mobility: normal     Mouth opening:  3 or more finger widths   Thyromental distance:  4 finger widths   Mallampati score:  I - soft palate, uvula, fauces, pillars visible   Pre-sedation assessments completed and reviewed: airway patency, cardiovascular function, hydration status, mental status, nausea/vomiting, pain level, respiratory function and temperature   Immediate pre-procedure details:    Reassessment: Patient reassessed immediately prior to procedure     Reviewed: vital signs, relevant labs/tests and NPO status     Verified: bag  valve mask available, emergency equipment available, intubation equipment available, IV patency confirmed, oxygen available and suction available   Procedure details (see MAR for exact dosages):    Preoxygenation:  Nasal cannula   Sedation:  Propofol   Intra-procedure monitoring:  Blood pressure monitoring, cardiac monitor, continuous pulse oximetry, frequent LOC assessments, frequent vital sign checks and continuous capnometry   Intra-procedure events: none     Total Provider sedation time (minutes):  15 Post-procedure details:    Attendance: Constant attendance by certified staff until patient recovered     Recovery: Patient returned to pre-procedure baseline     Post-sedation assessments completed and reviewed: airway patency, cardiovascular function, hydration status, mental status, nausea/vomiting, pain level, respiratory function and temperature     Patient is stable for discharge or admission: yes     Patient tolerance:  Tolerated well, no immediate complications Comments:     Given 300mg  propofol to achieve sedation.    MDM  28yo male presents again with shoulder dislocation. Had presented today and was reduced in the ED by [facility], me, and Dr. Darl Householder, who dislocated his shoulder after discharge and returns.  Sedated again and reduced by [facility] and me. Returned to baseline. In shoulder immobilizer. Patient discharged in stable condition with understanding of reasons to return.        Gareth Morgan, MD 10/26/18 [phone]

## 2018-11-22 ENCOUNTER — Telehealth: Payer: Self-pay | Admitting: Neurology

## 2018-11-22 NOTE — Telephone Encounter (Signed)
Elenore Paddy R        Thanks for trusting the care of your patient to our office. We would love to schedule an appointment, however patient was a NO SHOW for their appointment on 10/11/18 with Dr Erlinda Hong.  At this time we will not reach out to the patient to reschedule.     Clance Boll  Referral Coordinator  [facility]  [phone]     I called and left patient a message stating she needed to call them to reschedule .

## 2018-12-01 ENCOUNTER — Emergency Department (HOSPITAL_COMMUNITY): Payer: Self-pay

## 2018-12-01 ENCOUNTER — Emergency Department (HOSPITAL_COMMUNITY)
Admission: EM | Admit: 2018-12-01 | Discharge: 2018-12-01 | Disposition: A | Discharge status: Home / Self Care | Payer: Self-pay | Attending: Emergency Medicine | Admitting: Emergency Medicine

## 2018-12-01 ENCOUNTER — Other Ambulatory Visit: Payer: Self-pay

## 2018-12-01 DIAGNOSIS — Z79899 Other long term (current) drug therapy: Secondary | ICD-10-CM | POA: Insufficient documentation

## 2018-12-01 DIAGNOSIS — S43015A Anterior dislocation of left humerus, initial encounter: Secondary | ICD-10-CM | POA: Insufficient documentation

## 2018-12-01 DIAGNOSIS — Y999 Unspecified external cause status: Secondary | ICD-10-CM | POA: Insufficient documentation

## 2018-12-01 DIAGNOSIS — Y929 Unspecified place or not applicable: Secondary | ICD-10-CM | POA: Insufficient documentation

## 2018-12-01 DIAGNOSIS — F17210 Nicotine dependence, cigarettes, uncomplicated: Secondary | ICD-10-CM | POA: Insufficient documentation

## 2018-12-01 DIAGNOSIS — X58XXXA Exposure to other specified factors, initial encounter: Secondary | ICD-10-CM | POA: Insufficient documentation

## 2018-12-01 DIAGNOSIS — Y9389 Activity, other specified: Secondary | ICD-10-CM | POA: Insufficient documentation

## 2018-12-01 MED ORDER — PROPOFOL 10 MG/ML IV BOLUS
INTRAVENOUS | Status: AC
  Filled 2018-12-01: qty 20

## 2018-12-01 MED ORDER — PROPOFOL 10 MG/ML IV BOLUS
INTRAVENOUS | Status: AC | PRN
  Administered 2018-12-01 (×2): 30 mg via INTRAVENOUS
  Administered 2018-12-01: 34 mg via INTRAVENOUS
  Administered 2018-12-01: 30 mg via INTRAVENOUS
  Administered 2018-12-01: 26 mg via INTRAVENOUS
  Administered 2018-12-01: 50 mg via INTRAVENOUS

## 2018-12-01 MED ORDER — ACETAMINOPHEN 325 MG PO TABS
650.0000 mg | ORAL_TABLET | Freq: Once | ORAL | Status: AC
  Administered 2018-12-01: 650 mg via ORAL
  Filled 2018-12-01: qty 2

## 2018-12-01 MED ORDER — PROPOFOL 10 MG/ML IV BOLUS
0.5000 mg/kg | Freq: Once | INTRAVENOUS | Status: DC
  Filled 2018-12-01: qty 20

## 2018-12-01 NOTE — ED Provider Notes (Signed)
[place] EMERGENCY DEPARTMENT Provider Note   CSN: [id number] Arrival date & time: 12/01/18  5035     History   Chief Complaint Chief Complaint  Patient presents with  . Shoulder Pain    HPI Jason Davenport is a 28 y.o. male with past medical history significant for seizures, chronic left shoulder dislocation presents emergency department today with chief complaint of left shoulder pain x 2 hours.  Patient states he slept on his left side and when he woke up this morning he thought his shoulder had dislocated.  He is describing sharp shooting pain in left arm.  Pain is constant and worse with movement.  He rates it 10 of 10 in severity.  He did not take anything for pain prior to arrival.  Last PO intake was approximately 12 hours ago when he had dinner.  He denies fever, chills, injury or trauma, numbness, tingling, decrease sensation.  Chart review shows patient has been seen multiple times for dislocation of left shoulder, at least 7 other times.  His most recent was 11/25/2018 at an outside hospital.  He had a CT scan done at that visit that showed joint effusion and  associated bony Bankart lesion and concurrent Hill-Sachs lesion. The ED provider was able to reduce the shoulder and placed patient in immobilizer x3 however according to the note his shoulder immediately dislocated again.  Patient was taken to the OR that day and underwent closed reduction of left shoulder.  He has not yet followed up with orthopedics and is requesting referral to an orthopedist in [place].    Past Medical History:  Diagnosis Date  . Seizure [facility])     Patient Active Problem List   Diagnosis Date Noted  . Seizures ([place]) 09/29/2018  . Anterior dislocation of left shoulder 09/29/2018  . Seizure ([place]) 08/17/2018  . Anterior shoulder dislocation, left, initial encounter 08/17/2018    Past Surgical History:  Procedure Laterality Date  . no past surgeries          Home  Medications    Prior to Admission medications   Medication Sig Start Date End Date Taking? Authorizing Provider  HYDROcodone-acetaminophen (NORCO/VICODIN) 5-325 MG tablet Take 1-2 tablets by mouth every 6 (six) hours as needed. 10/23/18   Volanda Napoleon, PA-C  levETIRAcetam (KEPPRA XR) 500 MG 24 hr tablet Take 2 tablets (1,000 mg total) by mouth at bedtime. 09/29/18   Marcial Pacas, MD  levETIRAcetam (KEPPRA) 500 MG tablet Take 1 tablet (500 mg total) by mouth 2 (two) times daily. 08/18/18   Geradine Girt, DO    Family History Family History  Problem Relation Age of Onset  . Hypertension Mother   . Hypertension Father     Social History Social History   Tobacco Use  . Smoking status: Current Every Day Smoker    Packs/day: 0.25    Types: Cigarettes  . Smokeless tobacco: Never Used  Substance Use Topics  . Alcohol use: No  . Drug use: Yes    Types: Marijuana     Allergies   Patient has no known allergies.   Review of Systems Review of Systems  Constitutional: Negative for chills and fever.  HENT: Negative for congestion, rhinorrhea, sinus pressure and sore throat.   Eyes: Negative for pain and redness.  Respiratory: Negative for cough, shortness of breath and wheezing.   Cardiovascular: Negative for chest pain and palpitations.  Gastrointestinal: Negative for abdominal pain, constipation, diarrhea, nausea and vomiting.  Genitourinary: Negative  for dysuria.  Musculoskeletal: Positive for arthralgias. Negative for back pain, myalgias and neck pain.  Skin: Negative for rash and wound.  Neurological: Negative for dizziness, syncope, weakness, numbness and headaches.  Psychiatric/Behavioral: Negative for confusion.     Physical Exam Updated Vital Signs BP (!) 134/98 (BP Location: Right Arm)   Pulse 81   Temp 98 F (36.7 C) (Oral)   Resp 16   Ht 6' (1.829 m)   Wt 68 kg   SpO2 100%   BMI 20.34 kg/m   Physical Exam Vitals signs and nursing note reviewed.   Constitutional:      General: He is not in acute distress.    Appearance: He is not ill-appearing.  HENT:     Head: Normocephalic and atraumatic.     Right Ear: Tympanic membrane and external ear normal.     Left Ear: Tympanic membrane and external ear normal.     Nose: Nose normal.     Mouth/Throat:     Mouth: Mucous membranes are moist.     Pharynx: Oropharynx is clear.  Eyes:     General: No scleral icterus.       Right eye: No discharge.        Left eye: No discharge.     Extraocular Movements: Extraocular movements intact.     Conjunctiva/sclera: Conjunctivae normal.     Pupils: Pupils are equal, round, and reactive to light.  Neck:     Musculoskeletal: Normal range of motion.     Vascular: No JVD.  Cardiovascular:     Rate and Rhythm: Normal rate and regular rhythm.     Pulses: Normal pulses.          Radial pulses are 2+ on the right side and 2+ on the left side.     Heart sounds: Normal heart sounds.  Pulmonary:     Comments: Lungs clear to auscultation in all fields. Symmetric chest rise. No wheezing, rales, or rhonchi. Abdominal:     Comments: Abdomen is soft, non-distended, and non-tender in all quadrants. No rigidity, no guarding. No peritoneal signs.  Musculoskeletal: Normal range of motion.     Comments: Obvious anterior dislocation of left shoulder. Bony tenderness to palpation. Neurovascularly intact distally. Compartments soft above and below affected joint. Decreased ROM secondary to pain. Able to wiggle all fingers on left hand. Left wrist and elbow are non tender to palpation and have full ROM. Grip strength is strong and equal in bilateral upper extremities. RUE without tenderness, full ROM.  Skin:    General: Skin is warm and dry.     Capillary Refill: Capillary refill takes less than 2 seconds.  Neurological:     Mental Status: He is oriented to person, place, and time.     GCS: GCS eye subscore is 4. GCS verbal subscore is 5. GCS motor subscore is 6.      Comments: Fluent speech, no facial droop.  Psychiatric:        Behavior: Behavior normal.      ED Treatments / Results  Labs (all labs ordered are listed, but only abnormal results are displayed) Labs Reviewed - No data to display  EKG None  Radiology Dg Shoulder Left  Result Date: 12/01/2018 CLINICAL DATA:  Dislocation.  History of seizures EXAM: LEFT SHOULDER - 2+ VIEW COMPARISON:  October 23, 2018 FINDINGS: Frontal and Y scapular images obtained. There is a subcoracoid anterior dislocation. There is a Hill-Sachs defect on the left, noted previously. No acute  appearing fracture. No appreciable joint space narrowing or erosion. Visualized left lung clear. IMPRESSION: Subcoracoid anterior dislocation. Call [place] GapSachs defect noted, also present previously. No other evident fracture. No appreciable arthropathic change. Electronically Signed   By: Bretta BangWilliam  Woodruff III M.D.   On: 12/01/2018 08:47    Procedures .Splint Application  Date/Time: 12/01/2018 11:02 AM Performed by: Sherene SiresAlbrizze, Stellah Donovan E, PA-C Authorized by: Sherene SiresAlbrizze, Janele Lague E, PA-C   Consent:    Consent obtained:  Written   Consent given by:  Patient   Risks discussed:  Discoloration, numbness and pain   Alternatives discussed:  No treatment Universal protocol:    Procedure explained and questions answered to patient or proxy's satisfaction: yes     Relevant documents present and verified: yes     Imaging studies available: yes     Immediately prior to procedure a time out was called: yes   Pre-procedure details:    Sensation:  Normal Procedure details:    Laterality:  Left   Location:  Shoulder   Supplies:  Sling Post-procedure details:    Pain:  Improved   Sensation:  Normal   Patient tolerance of procedure:  Tolerated well, no immediate complications   (including critical care time)  Medications Ordered in ED Medications  propofol (DIPRIVAN) 10 mg/mL bolus/IV push 34 mg (34 mg Intravenous See Procedure  Record 12/01/18 0943)  acetaminophen (TYLENOL) tablet 650 mg (650 mg Oral Given 12/01/18 0838)  propofol (DIPRIVAN) 10 mg/mL bolus/IV push (26 mg Intravenous Given 12/01/18 0938)     Initial Impression / Assessment and Plan / ED Course  I have reviewed the triage vital signs and the nursing notes.  Pertinent labs & imaging results that were available during my care of the patient were reviewed by me and considered in my medical decision making (see chart for details).  Patient presents to the ED with complaints of pain to the left shoulder after sleeping on it last night. He has history of multiple dislocations to left shoulder, last required OR reduction in outside hospital on 11/25/2018. Exam today shows obvious anterior dislocation, he is neurovascularly intact distally. Compartments soft above and below affected joint. Grip strength strong and equal. Limited ROM secondary to pain. Xray viewed by me shows subcoracoid anterior dislocation with known Tamera ReasonHill Sachs defect seen on previous xray.   Successful reduction performed by ED attending Dr. Rubin PayorPickering. Please see his separate procedure and sedation notes. Pt will full ROM after reduction. Patient placed in sling immobilizer. Pt is neurovascularly intact distally. Compartments soft above and below affected joint after reduction. Case discussed with on call ortho PA Earney HamburgMichael Jeffrey who recommends outpatient follow up with ortho Dr. Osie BondLandeau. There are no indications for emergent intervention at this time. Pt advised to wear sling until ortho follow up.This is shared ED visit with attending Dr. Rubin PayorPickering. He personally saw and evaluated patient.  Portions of this note were generated with Scientist, clinical (histocompatibility and immunogenetics)Dragon dictation software. Dictation errors may occur despite best attempts at proofreading.    Final Clinical Impressions(s) / ED Diagnoses   Final diagnoses:  Anterior dislocation of left shoulder, initial encounter    ED Discharge Orders    None        Sherene Sireslbrizze, Lasya Vetter E, PA-C 12/01/18 1104    Benjiman CorePickering, Nathan, MD 12/01/18 1624

## 2018-12-01 NOTE — ED Provider Notes (Signed)
  Physical Exam  BP (!) 140/98 (BP Location: Right Arm)   Pulse 72   Temp 98.2 F (36.8 C) (Oral)   Resp 16   Ht 6' (1.829 m)   Wt 68 kg   SpO2 100%   BMI 20.34 kg/m   Physical Exam  ED Course/Procedures     .Ortho Injury Treatment  Date/Time: 12/01/2018 4:19 PM Performed by: Davonna Belling, MD Authorized by: Davonna Belling, MD   Consent:    Consent obtained:  Written   Consent given by:  Patient   Risks discussed:  Fracture, irreducible dislocation, nerve damage, recurrent dislocation, restricted joint movement and stiffness   Alternatives discussed:  No treatmentInjury location: shoulder Location details: left shoulder Injury type: dislocation Dislocation type: anterior Hill-Sachs deformity: yes Chronicity: recurrent Pre-procedure neurovascular assessment: neurovascularly intact Pre-procedure distal perfusion: normal Pre-procedure range of motion: reduced  Anesthesia: Local anesthesia used: no Manipulation performed: yes Reduction method: traction and counter traction Reduction successful: yes Immobilization: sling Post-procedure neurovascular assessment: post-procedure neurovascularly intact Post-procedure range of motion: improved Patient tolerance: patient tolerated the procedure well with no immediate complications  Sedation procedure  Date/Time: 12/01/2018 4:20 PM Performed by: Davonna Belling, MD Authorized by: Davonna Belling, MD   Consent:    Consent obtained:  Written   Consent given by:  Patient   Risks discussed:  Allergic reaction, dysrhythmia, inadequate sedation, nausea, vomiting, respiratory compromise necessitating ventilatory assistance and intubation and prolonged hypoxia resulting in organ damage   Alternatives discussed:  Analgesia without sedation Universal protocol:    Immediately prior to procedure a time out was called: yes     Patient identity confirmation method:  Arm band Indications:    Procedure performed:   Dislocation reduction   Procedure necessitating sedation performed by:  Physician performing sedation Pre-sedation assessment:    Time since last food or drink:  4   ASA classification: class 1 - normal, healthy patient     Neck mobility: normal     Mouth opening:  3 or more finger widths   Thyromental distance:  4 finger widths   Mallampati score:  I - soft palate, uvula, fauces, pillars visible   Pre-sedation assessments completed and reviewed: airway patency   Immediate pre-procedure details:    Reassessment: Patient reassessed immediately prior to procedure     Reviewed: vital signs and relevant labs/tests     Verified: bag valve mask available   Procedure details (see MAR for exact dosages):    Preoxygenation:  Nasal cannula   Sedation:  Propofol   Intended level of sedation: deep   Intra-procedure monitoring:  Blood pressure monitoring, cardiac monitor and continuous pulse oximetry   Intra-procedure events: none     Total Provider sedation time (minutes):  10 Post-procedure details:    Attendance: Constant attendance by certified staff until patient recovered     Recovery: Patient returned to pre-procedure baseline     Post-sedation assessments completed and reviewed: airway patency, cardiovascular function, hydration status and mental status     Post-sedation assessments completed and reviewed: nausea/vomiting not reviewed     Patient is stable for discharge or admission: yes     Patient tolerance:  Tolerated well, no immediate complications Comments:     Patient required 200 mg of propofol rather quickly to get enough sedation.    MDM         Davonna Belling, MD 12/01/18 1622

## 2018-12-01 NOTE — ED Notes (Signed)
Procedural consent signed and placed in medical records.

## 2018-12-01 NOTE — ED Notes (Signed)
Patient verbalizes understanding of discharge instructions. Opportunity for questioning and answers were provided. Armband removed by staff, pt discharged from ED.  

## 2018-12-01 NOTE — ED Triage Notes (Signed)
Pt reports slept on his shoulder wrong last night. Thinks it may be out of joint, hx of same.

## 2018-12-01 NOTE — Progress Notes (Signed)
Orthopedic Tech Progress Note Patient Details:  Jason Davenport [birth year] [id number] DR did a reduction of the shoulder and needed ortho at bedside.  Ortho Devices Type of Ortho Device: Arm sling Ortho Device/Splint Location: ULE Ortho Device/Splint Interventions: Adjustment, Application, Ordered   Post Interventions Patient Tolerated: Well Instructions Provided: Care of device, Adjustment of device   [name] 12/01/2018, 9:50 AM

## 2018-12-01 NOTE — Discharge Instructions (Addendum)
You were seen today for pain in your left shoulder. Please read and follow all provided instructions.   Your shoulder was successfully put in place by the emergency room doctor.   1. Medications: alternate ibuprofen and tylenol for pain control,  2. Treatment: Wear the sling until you follow up with the orthopedist Dr. Mardelle Matte. Rest, ice, elevate.  3. Follow Up: Please followup with orthopedics. Call the office later today to schedule a follow up appointment. Please return to the ER for worsening symptoms or other concerns

## 2018-12-01 NOTE — Sedation Documentation (Signed)
Successful reduction of left shoulder by EDP.

## 2018-12-13 ENCOUNTER — Other Ambulatory Visit: Payer: Self-pay

## 2018-12-13 ENCOUNTER — Emergency Department (HOSPITAL_COMMUNITY)
Admission: EM | Admit: 2018-12-13 | Discharge: 2018-12-13 | Discharge status: Against Medical Advice | Payer: Self-pay | Attending: Emergency Medicine | Admitting: Emergency Medicine

## 2018-12-13 ENCOUNTER — Encounter (HOSPITAL_COMMUNITY): Payer: Self-pay | Admitting: Emergency Medicine

## 2018-12-13 DIAGNOSIS — Z5321 Procedure and treatment not carried out due to patient leaving prior to being seen by health care provider: Secondary | ICD-10-CM | POA: Insufficient documentation

## 2018-12-13 NOTE — ED Notes (Signed)
Pt not in waiting room when called for room or xray.
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
## 2018-12-19 ENCOUNTER — Other Ambulatory Visit: Payer: Self-pay | Admitting: Orthopedic Surgery

## 2018-12-19 DIAGNOSIS — M25512 Pain in left shoulder: Secondary | ICD-10-CM

## 2018-12-22 ENCOUNTER — Encounter: Payer: Self-pay | Admitting: Neurology

## 2018-12-22 ENCOUNTER — Other Ambulatory Visit: Payer: Self-pay

## 2018-12-22 ENCOUNTER — Ambulatory Visit
Admission: RE | Admit: 2018-12-22 | Discharge: 2018-12-22 | Disposition: A | Discharge status: Home / Self Care | Payer: Self-pay | Source: Ambulatory Visit | Attending: Orthopedic Surgery | Admitting: Orthopedic Surgery

## 2018-12-22 DIAGNOSIS — M25512 Pain in left shoulder: Secondary | ICD-10-CM

## 2019-01-11 ENCOUNTER — Emergency Department (HOSPITAL_COMMUNITY): Payer: Self-pay

## 2019-01-11 ENCOUNTER — Encounter (HOSPITAL_COMMUNITY): Payer: Self-pay | Admitting: Emergency Medicine

## 2019-01-11 ENCOUNTER — Other Ambulatory Visit: Payer: Self-pay

## 2019-01-11 ENCOUNTER — Emergency Department (HOSPITAL_COMMUNITY)
Admission: EM | Admit: 2019-01-11 | Discharge: 2019-01-11 | Disposition: A | Discharge status: Home / Self Care | Payer: Self-pay | Attending: Emergency Medicine | Admitting: Emergency Medicine

## 2019-01-11 DIAGNOSIS — M24412 Recurrent dislocation, left shoulder: Secondary | ICD-10-CM | POA: Insufficient documentation

## 2019-01-11 DIAGNOSIS — F17210 Nicotine dependence, cigarettes, uncomplicated: Secondary | ICD-10-CM | POA: Insufficient documentation

## 2019-01-11 DIAGNOSIS — Q7192 Unspecified reduction defect of left upper limb: Secondary | ICD-10-CM

## 2019-01-11 DIAGNOSIS — S43015A Anterior dislocation of left humerus, initial encounter: Secondary | ICD-10-CM

## 2019-01-11 DIAGNOSIS — Z79899 Other long term (current) drug therapy: Secondary | ICD-10-CM | POA: Insufficient documentation

## 2019-01-11 MED ORDER — HYDROMORPHONE HCL 1 MG/ML IJ SOLN
1.0000 mg | Freq: Once | INTRAMUSCULAR | Status: DC
  Filled 2019-01-11: qty 1

## 2019-01-11 MED ORDER — PROPOFOL 10 MG/ML IV BOLUS
300.0000 mg | Freq: Once | INTRAVENOUS | Status: DC
  Filled 2019-01-11: qty 40

## 2019-01-11 MED ORDER — PROPOFOL 10 MG/ML IV BOLUS: 1.0000 mg/kg | Freq: Once | INTRAVENOUS | Status: DC

## 2019-01-11 MED ORDER — PROPOFOL 10 MG/ML IV BOLUS
INTRAVENOUS | Status: AC | PRN
  Administered 2019-01-11: 200 ug via INTRAVENOUS

## 2019-01-11 MED ORDER — HYDROMORPHONE HCL 1 MG/ML IJ SOLN
INTRAMUSCULAR | Status: AC | PRN
  Administered 2019-01-11: 1 mg via INTRAVENOUS

## 2019-01-11 NOTE — Sedation Documentation (Signed)
Shoulder immobilizer placed onto pt

## 2019-01-11 NOTE — ED Triage Notes (Signed)
Pt reports closing his door and dislocating his shoulder. States he is supposed to have surgery on it next month. Not wearing his sling.

## 2019-01-11 NOTE — ED Notes (Signed)
Patient verbalizes understanding of discharge instructions. Opportunity for questioning and answers were provided. Armband removed by staff, pt discharged from ED.  

## 2019-01-11 NOTE — Sedation Documentation (Signed)
Portable xray completed. Pt is fully awake and oriented.

## 2019-01-11 NOTE — Progress Notes (Signed)
Patient requiered rescue breathing during procedure due to decreased respiratory effort.  MD and RT bagged patient using 100% Fi02 Sats never dropped below 94%.  End tidal CO2 40-45 throughout.  Patient regained consciousness and was returned to 2L nasal cannula sats 100%.  HR 50-60 throughout.

## 2019-01-11 NOTE — ED Notes (Signed)
Consent signed and placed in Med Rec drawer

## 2019-01-11 NOTE — Progress Notes (Signed)
Orthopedic Tech Progress Note Patient Details:  Jason Davenport 02/08/1991 [id number]  Ortho Devices Type of Ortho Device: Shoulder immobilizer Ortho Device/Splint Location: LUE Ortho Device/Splint Interventions: Ordered, Application   Post Interventions Patient Tolerated: Well Instructions Provided: Adjustment of device, Care of device   Staci Righter 01/11/2019, 9:56 PM

## 2019-01-11 NOTE — Sedation Documentation (Signed)
RT and EDP bagging pt at this time

## 2019-01-11 NOTE — ED Provider Notes (Signed)
[facility] EMERGENCY DEPARTMENT Provider Note   CSN: [id number] Arrival date & time: 01/11/19  1823     History   Chief Complaint Chief Complaint  Patient presents with  . Shoulder Pain    HPI Jason Davenport is a 28 y.o. male.     HPI   28 year old male with history of recurrent anterior left shoulder dislocations, seizure, presents with concern for shoulder dislocation.  Reports that he had not been wearing his sling, and went to go close the door, and felt his shoulder dislocate.  Reports he is scheduled to have surgery in December.  Reports he has been trying not to use his left arm but admits that he has not been regularly using his sling immobilizer.  Reports ear severe pain at the moment of dislocation.  Reports it is worse if he tries to move, however is better when he is still on at rest.  Denies any numbness, weakness, denies any other concerns or injuries.  Past Medical History:  Diagnosis Date  . Seizure [facility])     Patient Active Problem List   Diagnosis Date Noted  . Seizures (HCC) 09/29/2018  . Anterior dislocation of left shoulder 09/29/2018  . Seizure (HCC) 08/17/2018  . Anterior shoulder dislocation, left, initial encounter 08/17/2018    Past Surgical History:  Procedure Laterality Date  . no past surgeries          Home Medications    Prior to Admission medications   Medication Sig Start Date End Date Taking? Authorizing Provider  HYDROcodone-acetaminophen (NORCO/VICODIN) 5-325 MG tablet Take 1-2 tablets by mouth every 6 (six) hours as needed. 10/23/18   Maxwell Caul, PA-C  levETIRAcetam (KEPPRA XR) 500 MG 24 hr tablet Take 2 tablets (1,000 mg total) by mouth at bedtime. 09/29/18   Levert Feinstein, MD  levETIRAcetam (KEPPRA) 500 MG tablet Take 1 tablet (500 mg total) by mouth 2 (two) times daily. 08/18/18   Joseph Art, DO    Family History Family History  Problem Relation Age of Onset  . Hypertension Mother   . Hypertension  Father     Social History Social History   Tobacco Use  . Smoking status: Current Every Day Smoker    Packs/day: 0.25    Types: Cigarettes  . Smokeless tobacco: Never Used  Substance Use Topics  . Alcohol use: No  . Drug use: Yes    Types: Marijuana     Allergies   Patient has no known allergies.   Review of Systems Review of Systems  Constitutional: Negative for fever.  Respiratory: Negative for cough and shortness of breath.   Cardiovascular: Negative for chest pain.  Musculoskeletal: Positive for arthralgias. Negative for back pain and neck stiffness.  Skin: Negative for rash.  Neurological: Negative for syncope and numbness.     Physical Exam Updated Vital Signs BP 118/83   Pulse (!) 59   Temp 98 F (36.7 C) (Oral)   Resp 20   Ht 6' (1.829 m)   Wt 68 kg   SpO2 100%   BMI 20.34 kg/m   Physical Exam Vitals signs and nursing note reviewed.  Constitutional:      General: He is not in acute distress.    Appearance: He is well-developed. He is not diaphoretic.  HENT:     Head: Normocephalic and atraumatic.  Eyes:     Conjunctiva/sclera: Conjunctivae normal.  Neck:     Musculoskeletal: Normal range of motion.  Cardiovascular:  Rate and Rhythm: Normal rate and regular rhythm.  Pulmonary:     Effort: Pulmonary effort is normal. No respiratory distress.     Breath sounds: Normal breath sounds. No wheezing or rales.  Musculoskeletal:     Comments: Deformity left shoulder Normal pulses, movement of fingers, sensation  Skin:    General: Skin is warm and dry.  Neurological:     Mental Status: He is alert and oriented to person, place, and time.      ED Treatments / Results  Labs (all labs ordered are listed, but only abnormal results are displayed) Labs Reviewed - No data to display  EKG None  Radiology Dg Shoulder Left  Result Date: 01/11/2019 CLINICAL DATA:  Left shoulder pain after injury. Dislocated shoulder while closing a door. EXAM:  LEFT SHOULDER - 2+ VIEW COMPARISON:  12/22/2018 FINDINGS: Anterior inferior shoulder dislocation. Large Hill-Sachs impaction fracture to the lateral humeral head. Os acromial. No visualized bony Bankart. Acromioclavicular joint is congruent. IMPRESSION: Anterior inferior shoulder dislocation. Large Hill-Sachs impaction fracture to the lateral humeral head. Electronically Signed   By: Narda Rutherford M.D.   On: 01/11/2019 19:03   Dg Shoulder Left Port  Result Date: 01/11/2019 CLINICAL DATA:  Left shoulder post reduction EXAM: LEFT SHOULDER COMPARISON:  Radiograph 01/11/2019 FINDINGS: Successful relocation of the humeral head into the glenoid fossa. A marked Hill-Sachs deformity of the posterolateral humeral head is noted. No other acute osseous abnormality is seen. Mild residual swelling. IMPRESSION: 1. Successful relocation of the humeral head into the glenoid fossa. 2. Marked Hill-Sachs deformity of the posterolateral humeral head. Electronically Signed   By: Kreg Shropshire M.D.   On: 01/11/2019 22:11    Procedures .Sedation  Date/Time: 01/12/2019 12:59 AM Performed by: Alvira Monday, MD Authorized by: Alvira Monday, MD   Consent:    Consent obtained:  Verbal   Consent given by:  Patient   Risks discussed:  Allergic reaction, dysrhythmia, inadequate sedation, nausea, prolonged hypoxia resulting in organ damage, prolonged sedation necessitating reversal, respiratory compromise necessitating ventilatory assistance and intubation and vomiting   Alternatives discussed:  Analgesia without sedation, anxiolysis and regional anesthesia Universal protocol:    Procedure explained and questions answered to patient or proxy's satisfaction: yes     Relevant documents present and verified: yes     Test results available and properly labeled: yes     Imaging studies available: yes     Required blood products, implants, devices, and special equipment available: yes     Site/side marked: yes      Immediately prior to procedure a time out was called: yes     Patient identity confirmation method:  Verbally with patient Indications:    Procedure necessitating sedation performed by:  Physician performing sedation Pre-sedation assessment:    Time since last food or drink:  4   ASA classification: class 1 - normal, healthy patient     Neck mobility: normal     Mouth opening:  3 or more finger widths   Thyromental distance:  4 finger widths   Mallampati score:  I - soft palate, uvula, fauces, pillars visible   Pre-sedation assessments completed and reviewed: airway patency, cardiovascular function, hydration status, mental status, nausea/vomiting, pain level, respiratory function and temperature   Immediate pre-procedure details:    Reassessment: Patient reassessed immediately prior to procedure     Reviewed: vital signs, relevant labs/tests and NPO status     Verified: bag valve mask available, emergency equipment available, intubation equipment available,  IV patency confirmed, oxygen available and suction available   Procedure details (see MAR for exact dosages):    Preoxygenation:  Nasal cannula   Sedation:  Propofol   Intended level of sedation: deep   Analgesia:  Hydromorphone   Intra-procedure monitoring:  Blood pressure monitoring, cardiac monitor, continuous pulse oximetry, frequent LOC assessments, frequent vital sign checks and continuous capnometry   Intra-procedure events: respiratory depression     Intra-procedure management:  Airway repositioning, BVM ventilation and supplemental oxygen   Total Provider sedation time (minutes):  20 Post-procedure details:    Attendance: Constant attendance by certified staff until patient recovered     Recovery: Patient returned to pre-procedure baseline     Post-sedation assessments completed and reviewed: airway patency, cardiovascular function, hydration status, mental status, nausea/vomiting, pain level, respiratory function and  temperature     Patient is stable for discharge or admission: yes     Patient tolerance:  Tolerated well, no immediate complications Reduction of dislocation  Date/Time: 01/12/2019 1:02 AM Performed by: Gareth Morgan, MD Authorized by: Gareth Morgan, MD  Consent: Verbal consent obtained. Written consent obtained. Risks and benefits: risks, benefits and alternatives were discussed Consent given by: patient Patient understanding: patient states understanding of the procedure being performed Patient consent: the patient's understanding of the procedure matches consent given Procedure consent: procedure consent matches procedure scheduled Relevant documents: relevant documents present and verified Test results: test results available and properly labeled Site marked: the operative site was marked Imaging studies: imaging studies available Required items: required blood products, implants, devices, and special equipment available Time out: Immediately prior to procedure a "time out" was called to verify the correct patient, procedure, equipment, support staff and site/side marked as required. Preparation: Patient was prepped and draped in the usual sterile fashion. Local anesthesia used: no  Anesthesia: Local anesthesia used: no  Sedation: Patient sedated: yes Sedation type: moderate (conscious) sedation Sedatives: propofol Analgesia: hydromorphone  Patient tolerance: patient tolerated the procedure well with no immediate complications    (including critical care time)  Medications Ordered in ED Medications  HYDROmorphone (DILAUDID) injection 1 mg (has no administration in time range)  propofol (DIPRIVAN) 10 mg/mL bolus/IV push 300 mg (has no administration in time range)  HYDROmorphone (DILAUDID) injection (1 mg Intravenous Given 01/11/19 2125)  propofol (DIPRIVAN) 10 mg/mL bolus/IV push (200 mcg Intravenous Given 01/11/19 2127)     Initial Impression / Assessment and  Plan / ED Course  I have reviewed the triage vital signs and the nursing notes.  Pertinent labs & imaging results that were available during my care of the patient were reviewed by me and considered in my medical decision making (see chart for details).        28 year old male with history of recurrent anterior left shoulder dislocations, seizure, presents with concern for shoulder dislocation.  X-ray confirms dislocation.  Discussed options for reduction including intra-articular lidocaine or sedation.  I previously performed shoulder reduction in September Mr. Ofallon, twice in the same day given recurrent dislocation as patient was walking out of the emergency department, at which time he required a significant amount of propofol in order to obtain proper sedation for reduction.  Note for first reduction was slowly giving propofol and attempting without success with patient eventually requiring 600mg , and on second was given 300mg  over shorter period of time, and noted on most recent sedation 10/15 also required 200mg  of propofol relatively quickly.   Given history of prior known large medication requirements, was given  200mg  for sedation but did have apnea that required BVM on this occasion. He returned to baseline with successful reduction of shoulder.  Emphasized the importance of wearing his immobilizer. Patient discharged in stable condition with understanding of reasons to return.     Final Clinical Impressions(s) / ED Diagnoses   Final diagnoses:  Anterior dislocation of left shoulder, initial encounter    ED Discharge Orders    None       Alvira MondaySchlossman, Macala Baldonado, MD 01/12/19 0104

## 2019-02-08 ENCOUNTER — Emergency Department (HOSPITAL_COMMUNITY)
Admission: EM | Admit: 2019-02-08 | Discharge: 2019-02-08 | Disposition: A | Discharge status: Home / Self Care | Payer: Self-pay | Attending: Emergency Medicine | Admitting: Emergency Medicine

## 2019-02-08 ENCOUNTER — Emergency Department (HOSPITAL_COMMUNITY): Payer: Self-pay

## 2019-02-08 ENCOUNTER — Other Ambulatory Visit: Payer: Self-pay

## 2019-02-08 ENCOUNTER — Encounter (HOSPITAL_COMMUNITY): Payer: Self-pay | Admitting: Emergency Medicine

## 2019-02-08 DIAGNOSIS — X58XXXA Exposure to other specified factors, initial encounter: Secondary | ICD-10-CM | POA: Insufficient documentation

## 2019-02-08 DIAGNOSIS — Z79899 Other long term (current) drug therapy: Secondary | ICD-10-CM | POA: Insufficient documentation

## 2019-02-08 DIAGNOSIS — Y9384 Activity, sleeping: Secondary | ICD-10-CM | POA: Insufficient documentation

## 2019-02-08 DIAGNOSIS — F17210 Nicotine dependence, cigarettes, uncomplicated: Secondary | ICD-10-CM | POA: Insufficient documentation

## 2019-02-08 DIAGNOSIS — S43005A Unspecified dislocation of left shoulder joint, initial encounter: Secondary | ICD-10-CM | POA: Insufficient documentation

## 2019-02-08 DIAGNOSIS — Y92013 Bedroom of single-family (private) house as the place of occurrence of the external cause: Secondary | ICD-10-CM | POA: Insufficient documentation

## 2019-02-08 DIAGNOSIS — R569 Unspecified convulsions: Secondary | ICD-10-CM | POA: Insufficient documentation

## 2019-02-08 DIAGNOSIS — Y999 Unspecified external cause status: Secondary | ICD-10-CM | POA: Insufficient documentation

## 2019-02-08 LAB — CBC WITH DIFFERENTIAL/PLATELET
Abs Immature Granulocytes: 0.1 10*3/uL — ABNORMAL HIGH (ref 0.00–0.07)
Basophils Absolute: 0.1 10*3/uL (ref 0.0–0.1)
Basophils Relative: 0 %
Eosinophils Absolute: 0.1 10*3/uL (ref 0.0–0.5)
Eosinophils Relative: 1 %
HCT: 59.7 % — ABNORMAL HIGH (ref 39.0–52.0)
Hemoglobin: 19.2 g/dL — ABNORMAL HIGH (ref 13.0–17.0)
Immature Granulocytes: 0 %
Lymphocytes Relative: 7 %
Lymphs Abs: 1.5 10*3/uL (ref 0.7–4.0)
MCH: 31 pg (ref 26.0–34.0)
MCHC: 32.2 g/dL (ref 30.0–36.0)
MCV: 96.4 fL (ref 80.0–100.0)
Monocytes Absolute: 2.5 10*3/uL — ABNORMAL HIGH (ref 0.1–1.0)
Monocytes Relative: 11 %
Neutro Abs: 18.3 10*3/uL — ABNORMAL HIGH (ref 1.7–7.7)
Neutrophils Relative %: 81 %
Platelets: 419 10*3/uL — ABNORMAL HIGH (ref 150–400)
RBC: 6.19 MIL/uL — ABNORMAL HIGH (ref 4.22–5.81)
RDW: 14.1 % (ref 11.5–15.5)
WBC: 22.5 10*3/uL — ABNORMAL HIGH (ref 4.0–10.5)
nRBC: 0 % (ref 0.0–0.2)

## 2019-02-08 LAB — BASIC METABOLIC PANEL WITH GFR
Anion gap: 26 — ABNORMAL HIGH (ref 5–15)
BUN: 8 mg/dL (ref 6–20)
CO2: 11 mmol/L — ABNORMAL LOW (ref 22–32)
Calcium: 10.4 mg/dL — ABNORMAL HIGH (ref 8.9–10.3)
Chloride: 104 mmol/L (ref 98–111)
Creatinine, Ser: 1.81 mg/dL — ABNORMAL HIGH (ref 0.61–1.24)
GFR calc Af Amer: 58 mL/min — ABNORMAL LOW
GFR calc non Af Amer: 50 mL/min — ABNORMAL LOW
Glucose, Bld: 141 mg/dL — ABNORMAL HIGH (ref 70–99)
Potassium: 4.6 mmol/L (ref 3.5–5.1)
Sodium: 141 mmol/L (ref 135–145)

## 2019-02-08 LAB — ETHANOL: Alcohol, Ethyl (B): <10 mg/dL

## 2019-02-08 LAB — CBG MONITORING, ED: Glucose-Capillary: 159 mg/dL — ABNORMAL HIGH (ref 70–99)

## 2019-02-08 MED ORDER — LEVETIRACETAM IN NACL 1000 MG/100ML IV SOLN
1000.0000 mg | Freq: Once | INTRAVENOUS | Status: AC
  Administered 2019-02-08: 17:00:00 1000 mg via INTRAVENOUS
  Filled 2019-02-08: qty 100

## 2019-02-08 MED ORDER — LORAZEPAM 2 MG/ML IJ SOLN
2.0000 mg | Freq: Once | INTRAMUSCULAR | Status: AC
  Administered 2019-02-08: 17:00:00 2 mg via INTRAVENOUS
  Filled 2019-02-08: qty 1

## 2019-02-08 MED ORDER — PROPOFOL 10 MG/ML IV BOLUS
35.0000 mg | Freq: Once | INTRAVENOUS | Status: AC
  Administered 2019-02-08: 35 mg via INTRAVENOUS
  Filled 2019-02-08: qty 20

## 2019-02-08 MED ORDER — PROPOFOL 10 MG/ML IV BOLUS
INTRAVENOUS | Status: AC
  Filled 2019-02-08: qty 20

## 2019-02-08 MED ORDER — PROPOFOL 10 MG/ML IV BOLUS
INTRAVENOUS | Status: AC | PRN
  Administered 2019-02-08 (×4): 40 mg via INTRAVENOUS
  Administered 2019-02-08: 30 mg via INTRAVENOUS
  Administered 2019-02-08: 60 mg via INTRAVENOUS

## 2019-02-08 NOTE — ED Notes (Signed)
Patient verbalizes understanding of discharge instructions. Opportunity for questioning and answers were provided. Armband removed by staff, pt discharged from ED ambulatory to home.  

## 2019-02-08 NOTE — ED Notes (Signed)
This RN and Kae Heller, EMT placed Shoulder Sling on L. Shoulder.

## 2019-02-08 NOTE — ED Notes (Signed)
ED provider made aware of elevated Troponin; no additional orders at this time.

## 2019-02-08 NOTE — ED Provider Notes (Signed)
.  Sedation  Date/Time: 02/08/2019 11:11 PM Performed by: Charlesetta Shanks, MD Authorized by: Charlesetta Shanks, MD   Consent:    Consent obtained:  Verbal   Consent given by:  Patient   Risks discussed:  Allergic reaction, dysrhythmia, inadequate sedation, nausea, prolonged hypoxia resulting in organ damage, prolonged sedation necessitating reversal, respiratory compromise necessitating ventilatory assistance and intubation and vomiting   Alternatives discussed:  Analgesia without sedation, anxiolysis and regional anesthesia Universal protocol:    Procedure explained and questions answered to patient or proxy's satisfaction: yes     Relevant documents present and verified: yes     Test results available and properly labeled: yes     Imaging studies available: yes     Required blood products, implants, devices, and special equipment available: yes     Site/side marked: yes     Immediately prior to procedure a time out was called: yes     Patient identity confirmation method:  Verbally with patient Indications:    Procedure necessitating sedation performed by:  Different physician Pre-sedation assessment:    Time since last food or drink:  12   NPO status caution: urgency dictates proceeding with non-ideal NPO status     ASA classification: class 1 - normal, healthy patient     Neck mobility: normal     Mouth opening:  3 or more finger widths   Thyromental distance:  4 finger widths   Mallampati score:  I - soft palate, uvula, fauces, pillars visible   Pre-sedation assessments completed and reviewed: airway patency, cardiovascular function, hydration status, mental status, nausea/vomiting, pain level, respiratory function and temperature   Immediate pre-procedure details:    Reassessment: Patient reassessed immediately prior to procedure     Reviewed: vital signs, relevant labs/tests and NPO status     Verified: bag valve mask available, emergency equipment available, intubation equipment  available, IV patency confirmed, oxygen available and suction available   Procedure details (see MAR for exact dosages):    Preoxygenation:  Nasal cannula   Sedation:  Propofol   Intended level of sedation: deep   Intra-procedure monitoring:  Blood pressure monitoring, cardiac monitor, continuous pulse oximetry, frequent LOC assessments, frequent vital sign checks and continuous capnometry   Intra-procedure events: none     Total Provider sedation time (minutes):  20 Post-procedure details:    Attendance: Constant attendance by certified staff until patient recovered     Recovery: Patient returned to pre-procedure baseline     Post-sedation assessments completed and reviewed: airway patency, cardiovascular function, hydration status, mental status, nausea/vomiting, pain level, respiratory function and temperature     Patient is stable for discharge or admission: yes     Patient tolerance:  Tolerated well, no immediate complications   Patient presented with recurrent left shoulder dislocation.  He also has seizure disorder.  Patient presented for his shoulder but did have a seizure after being brought back to the department.  This was treated with Keppra and Ativan.  Patient regained normal mental status.  Patient was still somewhat sedated post seizure and given 2 of Ativan.  I did attempt manual reduction without deep sedation.  This was not successful.  Patient was subsequently sedated with propafol for shoulder reduction.   Charlesetta Shanks, MD 02/08/19 [phone]

## 2019-02-08 NOTE — Discharge Instructions (Signed)
It is important for you to continue your Keppra as directed. You will need to follow-up with orthopedist for ultimate treatment of your recurrent shoulder dislocations. Return to the ED if you have another dislocation, another seizure, numbness in arms or legs, blurry vision, chest pain or shortness of breath. You are unable to drive until you are evaluated and cleared by your neurologist as you are at risk for getting another seizure.

## 2019-02-08 NOTE — ED Provider Notes (Signed)
Reduction of dislocation  Date/Time: 02/08/2019 6:45 PM Performed by: Maudie Flakes, MD Authorized by: Maudie Flakes, MD  Consent: The procedure was performed in an emergent situation. Patient identity confirmed: verbally with patient, arm band, provided demographic data and hospital-assigned identification number Time out: Immediately prior to procedure a "time out" was called to verify the correct patient, procedure, equipment, support staff and site/side marked as required. Local anesthesia used: no  Anesthesia: Local anesthesia used: no  Sedation: Patient sedated: yes  Patient tolerance: patient tolerated the procedure well with no immediate complications Comments: Reduction of left shoulder using multiple traction techniques.  Strong radial pulse post reduction.       Maudie Flakes, MD 02/08/19 [phone]

## 2019-02-08 NOTE — Sedation Documentation (Signed)
XRAY at Bedside.  

## 2019-02-08 NOTE — ED Triage Notes (Signed)
Pt reports L shoulder pain, states he "popped it out of joint" about 8  Months ago and since then it has been happening offf and on, he can normally get it back into place on his own but it popped out last night while he was sleeping and he has been unable to pop it back in. Obvious deformity noted. No numbness or tingling

## 2019-02-08 NOTE — Sedation Documentation (Signed)
Dr. Sedonia Small at bedside to assist.

## 2019-02-08 NOTE — ED Notes (Signed)
This RN was informed by Kae Heller, NT that pt was having a seizure. At bedside we witnessed the pt having a seizure lasting approx. 2-3 minutes in which pts arms and legs were contracted. After seizure pt was postictal and diaphoretic. ED Provider at bedside along with this RN and Gabriel Cirri, RN and Lenna Sciara, RN. VSS.

## 2019-02-08 NOTE — ED Notes (Signed)
Ambulates on own. No concerns or complaints.

## 2019-02-08 NOTE — ED Provider Notes (Signed)
[facility] EMERGENCY DEPARTMENT Provider Note   CSN: [id number] Arrival date & time: 02/08/19  1319     History Chief Complaint  Patient presents with  . Shoulder Injury    dislocation    Jason Davenport is a 28 y.o. male with a past medical history of seizures with questionable compliance with Keppra and recurrent shoulder dislocations presents to the ED for left shoulder dislocation.  I was called to the bedside when patient arrived in his room after he began seizing.  Patient postictal on my initial evaluation with left shoulder deformity noted. There was no head injury reported.  HPI     Past Medical History:  Diagnosis Date  . Seizure [facility])     Patient Active Problem List   Diagnosis Date Noted  . Seizures (HCC) 09/29/2018  . Anterior dislocation of left shoulder 09/29/2018  . Seizure (HCC) 08/17/2018  . Anterior shoulder dislocation, left, initial encounter 08/17/2018    Past Surgical History:  Procedure Laterality Date  . no past surgeries         Family History  Problem Relation Age of Onset  . Hypertension Mother   . Hypertension Father     Social History   Tobacco Use  . Smoking status: Current Every Day Smoker    Packs/day: 0.25    Types: Cigarettes  . Smokeless tobacco: Never Used  Substance Use Topics  . Alcohol use: No  . Drug use: Yes    Types: Marijuana    Home Medications Prior to Admission medications   Medication Sig Start Date End Date Taking? Authorizing Provider  HYDROcodone-acetaminophen (NORCO/VICODIN) 5-325 MG tablet Take 1-2 tablets by mouth every 6 (six) hours as needed. 10/23/18   Maxwell Caul, PA-C  levETIRAcetam (KEPPRA XR) 500 MG 24 hr tablet Take 2 tablets (1,000 mg total) by mouth at bedtime. 09/29/18   Levert Feinstein, MD  levETIRAcetam (KEPPRA) 500 MG tablet Take 1 tablet (500 mg total) by mouth 2 (two) times daily. 08/18/18   Joseph Art, DO    Allergies    Patient has no known allergies.  Review  of Systems   Review of Systems  Unable to perform ROS: Mental status change (postictal)  Musculoskeletal: Positive for arthralgias.  Neurological: Positive for seizures.    Physical Exam Updated Vital Signs BP 128/82   Pulse 91   Temp 97.9 F (36.6 C) (Oral)   Resp (!) 21   Wt 69 kg   SpO2 98%   BMI 20.63 kg/m   Physical Exam Vitals and nursing note reviewed.  Constitutional:      General: He is not in acute distress.    Appearance: He is well-developed. He is diaphoretic.  HENT:     Head: Normocephalic and atraumatic.     Nose: Nose normal.  Eyes:     General: No scleral icterus.       Right eye: No discharge.        Left eye: No discharge.     Conjunctiva/sclera: Conjunctivae normal.     Pupils: Pupils are equal, round, and reactive to light.  Cardiovascular:     Rate and Rhythm: Normal rate and regular rhythm.     Heart sounds: Normal heart sounds. No murmur. No friction rub. No gallop.   Pulmonary:     Effort: Pulmonary effort is normal. No respiratory distress.     Breath sounds: Normal breath sounds.  Abdominal:     General: Bowel sounds are normal. There is  no distension.     Palpations: Abdomen is soft.     Tenderness: There is no abdominal tenderness. There is no guarding.  Musculoskeletal:        General: Normal range of motion.     Cervical back: Normal range of motion and neck supple.     Comments: L shoulder deformity. 2+ radial pulse.  Skin:    General: Skin is warm.     Findings: No rash.  Neurological:     General: No focal deficit present.     Mental Status: He is alert and oriented to person, place, and time.     Cranial Nerves: No cranial nerve deficit.     Sensory: No sensory deficit.     Motor: No weakness or abnormal muscle tone.     Comments: After observation: Pupils reactive. No facial asymmetry noted. Cranial nerves appear grossly intact. Sensation intact to light touch on face, BUE and BLE. Strength 5/5 in BUE and BLE.      ED  Results / Procedures / Treatments   Labs (all labs ordered are listed, but only abnormal results are displayed) Labs Reviewed  BASIC METABOLIC PANEL - Abnormal; Notable for the following components:      Result Value   CO2 11 (*)    Glucose, Bld 141 (*)    Creatinine, Ser 1.81 (*)    Calcium 10.4 (*)    GFR calc non Af Amer 50 (*)    GFR calc Af Amer 58 (*)    Anion gap 26 (*)    All other components within normal limits  CBC WITH DIFFERENTIAL/PLATELET - Abnormal; Notable for the following components:   WBC 22.5 (*)    RBC 6.19 (*)    Hemoglobin 19.2 (*)    HCT 59.7 (*)    Platelets 419 (*)    Neutro Abs 18.3 (*)    Monocytes Absolute 2.5 (*)    Abs Immature Granulocytes 0.10 (*)    All other components within normal limits  CBG MONITORING, ED - Abnormal; Notable for the following components:   Glucose-Capillary 159 (*)    All other components within normal limits  ETHANOL  LEVETIRACETAM LEVEL  CBG MONITORING, ED    EKG None  Radiology DG Shoulder Left  Result Date: 02/08/2019 CLINICAL DATA:  Pain with suspected dislocation EXAM: LEFT SHOULDER - 2+ VIEW COMPARISON:  January 11, 2019 FINDINGS: Oblique and Y scapular images were obtained. There is a subcapital anterior shoulder dislocation. No acute fracture evident. Hill-Sachs defect better seen on most recent study. No appreciable underlying arthropathy. Visualized left lung clear. IMPRESSION: Subcapital anterior dislocation. No acute fracture. Hill-Sachs defect suggested on the Y scapular view but better seen on most recent study. Electronically Signed   By: Lowella Grip III M.D.   On: 02/08/2019 13:49   DG Shoulder Left Portable  Result Date: 02/08/2019 CLINICAL DATA:  28 year old male status post reduction of left shoulder. EXAM: LEFT SHOULDER COMPARISON:  Earlier radiograph dated 02/08/2019. FINDINGS: Evaluation is limited on the provided images. The humeral head however appears in anatomic alignment with the  glenoid. There is a large Hill-Sachs fracture defect involving the superolateral humeral head. Apparent focal cortical discontinuity of the medial humeral neck may represent a nondisplaced fracture. The soft tissues are unremarkable. IMPRESSION: 1. Normal alignment of the humeral head with glenoid. 2. Large Hill-Sachs fracture defect. 3. Possible nondisplaced fracture of the medial humeral neck. Electronically Signed   By: Anner Crete M.D.   On:  02/08/2019 19:02    Procedures Procedures (including critical care time)  Medications Ordered in ED Medications  levETIRAcetam (KEPPRA) IVPB 1000 mg/100 mL premix (0 mg Intravenous Stopped 02/08/19 1733)  LORazepam (ATIVAN) injection 2 mg (2 mg Intravenous Given 02/08/19 1712)  propofol (DIPRIVAN) 10 mg/mL bolus/IV push 35 mg (35 mg Intravenous Given 02/08/19 1926)  propofol (DIPRIVAN) 10 mg/mL bolus/IV push (40 mg Intravenous Given 02/08/19 1822)  propofol (DIPRIVAN) 10 mg/mL bolus/IV push (  Given 02/08/19 1926)    ED Course  I have reviewed the triage vital signs and the nursing notes.  Pertinent labs & imaging results that were available during my care of the patient were reviewed by me and considered in my medical decision making (see chart for details).  Clinical Course as of Feb 07 2258  Wed Feb 08, 2019  2028 Patient now awake post reduction.  I asked him about if he has followed up with the orthopedic surgeon about his recurrent dislocations or with a neurologist about his seizures.  States that he has an appointment tomorrow but he cannot remember whether this is for orthopedics or neurology.  He does report compliance with his home Keppra, but I am unsure of this.   [HK]  2153 Ambulate to tolerate by mouth, ambulate.   [HK]    Clinical Course User Index [HK] Dietrich PatesKhatri, Ngan Qualls, PA-C   MDM Rules/Calculators/A&P                      28 year old male with a past medical history of seizures, recurrent shoulder dislocations presents to  ED for left shoulder dislocation.  States that this occurred prior to arrival.  Unfortunately while patient was being roomed in the ED he had what appeared to be a tonic-clonic seizure.  He was postictal on my initial evaluation.  There is an obvious deformity noted of the left shoulder.  He was loaded with Keppra and given IV Ativan.  X-ray confirmed the shoulder dislocation.  He was sedated and this was reduced successfully. Post reduction films show possible fracture, but this is questionable as he states his pain and discomfort has improved.  His lab work did begin for leukocytosis of 22.5, anion gap of 26, mild elevation in creatinine to 1.8.  Keppra level is pending.  Patient was given IV fluids. I feel the labs are due to his seizure and dehydration.  As well as antiepileptics with no recurrence of his seizures.  He was observed here for about 6 hours and return back to baseline after sedation.  Patient is requesting discharge.  There is questionable compliance with his Keppra as I feel this is most likely the cause of his seizure today.  I stressed the importance of follow-up with his orthopedist and neurologist.  It appears that patient did try to follow-up with an orthopedist but was unable to do so due to insurance issues.  Continue his Keppra and return for worsening symptoms. Patient seen and evaluated by my attending, Dr. Donnald GarrePfeiffer.  Patient is hemodynamically stable, in NAD, and able to ambulate in the ED. Evaluation does not show pathology that would require ongoing emergent intervention or inpatient treatment. I explained the diagnosis to the patient. Pain has been managed and has no complaints prior to discharge. Patient is comfortable with above plan and is stable for discharge at this time. All questions were answered prior to disposition. Strict return precautions for returning to the ED were discussed. Encouraged follow up with PCP.  An After Visit Summary was printed and given to the  patient.   Portions of this note were generated with Scientist, clinical (histocompatibility and immunogenetics). Dictation errors may occur despite best attempts at proofreading.  Final Clinical Impression(s) / ED Diagnoses Final diagnoses:  Dislocation of left shoulder joint, initial encounter  Seizure-like activity [facility])    Rx / DC Orders ED Discharge Orders    None       Dietrich Pates, PA-C 02/08/19 2259    Arby Barrette, MD 02/08/19 2311

## 2019-02-13 LAB — LEVETIRACETAM LEVEL: Levetiracetam Lvl: <1 ug/mL — ABNORMAL LOW (ref 10.0–40.0)

## 2019-02-27 ENCOUNTER — Ambulatory Visit: Payer: Self-pay | Admitting: Neurology

## 2019-03-14 ENCOUNTER — Ambulatory Visit: Payer: Self-pay | Admitting: Orthopaedic Surgery

## 2019-06-08 ENCOUNTER — Encounter: Payer: Self-pay | Admitting: Neurology

## 2019-07-13 ENCOUNTER — Emergency Department (HOSPITAL_COMMUNITY): Payer: Self-pay

## 2019-07-13 ENCOUNTER — Emergency Department (HOSPITAL_COMMUNITY)
Admission: EM | Admit: 2019-07-13 | Discharge: 2019-07-13 | Disposition: A | Discharge status: Home / Self Care | Payer: Self-pay | Attending: Emergency Medicine | Admitting: Emergency Medicine

## 2019-07-13 ENCOUNTER — Encounter (HOSPITAL_COMMUNITY): Payer: Self-pay | Admitting: *Deleted

## 2019-07-13 DIAGNOSIS — F17210 Nicotine dependence, cigarettes, uncomplicated: Secondary | ICD-10-CM | POA: Insufficient documentation

## 2019-07-13 DIAGNOSIS — R569 Unspecified convulsions: Secondary | ICD-10-CM | POA: Insufficient documentation

## 2019-07-13 DIAGNOSIS — E875 Hyperkalemia: Secondary | ICD-10-CM | POA: Insufficient documentation

## 2019-07-13 DIAGNOSIS — Z79899 Other long term (current) drug therapy: Secondary | ICD-10-CM | POA: Insufficient documentation

## 2019-07-13 LAB — CBC WITH DIFFERENTIAL/PLATELET
Abs Immature Granulocytes: 0.12 10*3/uL — ABNORMAL HIGH (ref 0.00–0.07)
Basophils Absolute: 0 10*3/uL (ref 0.0–0.1)
Basophils Relative: 0 %
Eosinophils Absolute: 0 10*3/uL (ref 0.0–0.5)
Eosinophils Relative: 0 %
HCT: 46.8 % (ref 39.0–52.0)
Hemoglobin: 15.7 g/dL (ref 13.0–17.0)
Immature Granulocytes: 1 %
Lymphocytes Relative: 4 %
Lymphs Abs: 0.8 10*3/uL (ref 0.7–4.0)
MCH: 30.7 pg (ref 26.0–34.0)
MCHC: 33.5 g/dL (ref 30.0–36.0)
MCV: 91.4 fL (ref 80.0–100.0)
Monocytes Absolute: 1 10*3/uL (ref 0.1–1.0)
Monocytes Relative: 5 %
Neutro Abs: 17.2 10*3/uL — ABNORMAL HIGH (ref 1.7–7.7)
Neutrophils Relative %: 90 %
Platelets: 412 10*3/uL — ABNORMAL HIGH (ref 150–400)
RBC: 5.12 MIL/uL (ref 4.22–5.81)
RDW: 12.7 % (ref 11.5–15.5)
WBC: 19.1 10*3/uL — ABNORMAL HIGH (ref 4.0–10.5)
nRBC: 0 % (ref 0.0–0.2)

## 2019-07-13 LAB — COMPREHENSIVE METABOLIC PANEL
ALT: 26 U/L (ref 0–44)
AST: 48 U/L — ABNORMAL HIGH (ref 15–41)
Albumin: 4.8 g/dL (ref 3.5–5.0)
Alkaline Phosphatase: 78 U/L (ref 38–126)
Anion gap: 10 (ref 5–15)
BUN: 10 mg/dL (ref 6–20)
CO2: 24 mmol/L (ref 22–32)
Calcium: 9.3 mg/dL (ref 8.9–10.3)
Chloride: 103 mmol/L (ref 98–111)
Creatinine, Ser: 1.5 mg/dL — ABNORMAL HIGH (ref 0.61–1.24)
GFR calc Af Amer: >60 mL/min (ref >60)
GFR calc non Af Amer: >60 mL/min (ref >60)
Glucose, Bld: 139 mg/dL — ABNORMAL HIGH (ref 70–99)
Potassium: 5.4 mmol/L — ABNORMAL HIGH (ref 3.5–5.1)
Sodium: 137 mmol/L (ref 135–145)
Total Bilirubin: 1 mg/dL (ref 0.3–1.2)
Total Protein: 9 g/dL — ABNORMAL HIGH (ref 6.5–8.1)

## 2019-07-13 LAB — ETHANOL: Alcohol, Ethyl (B): <10 mg/dL (ref <10)

## 2019-07-13 MED ORDER — LEVETIRACETAM IN NACL 1000 MG/100ML IV SOLN
1000.0000 mg | Freq: Once | INTRAVENOUS | Status: AC
  Administered 2019-07-13: 1000 mg via INTRAVENOUS
  Filled 2019-07-13: qty 100

## 2019-07-13 MED ORDER — SODIUM ZIRCONIUM CYCLOSILICATE 5 G PO PACK
5.0000 g | PACK | ORAL | Status: DC
  Filled 2019-07-13: qty 1

## 2019-07-13 MED ORDER — LEVETIRACETAM ER 500 MG PO TB24
1000.0000 mg | ORAL_TABLET | Freq: Every day | ORAL | 1 refills | Status: DC
Start: 2019-07-13 — End: 2019-08-17

## 2019-07-13 MED ORDER — SODIUM CHLORIDE 0.9 % IV BOLUS
1000.0000 mL | Freq: Once | INTRAVENOUS | Status: AC
  Administered 2019-07-13: 1000 mL via INTRAVENOUS

## 2019-07-13 NOTE — Progress Notes (Addendum)
TOC CM spoke to pt and states his meds are free at his pharmacy. Has appt with Neurologist on 08/18/2019, does not have PCP. Gave CM permission to arrange appt with PCP or earlier appt with Neurologist. Verified his number. CM will call to arrange appt.  Pt states he has not taken his meds in the past 2 weeks. Education given to patient on the importance of taking meds as prescribed.   Isidoro Donning RN CCM, [facility] ED TOC CM [phone]

## 2019-07-13 NOTE — Discharge Instructions (Addendum)
Please call the [facility].  I have represcribed your Keppra however it is important for you to take this daily as prescribed  (I have given you a 24Hr tablet) in order to prevent future seizures.  As we discussed you may have severe injuries related to seizures if he continues to have them.  Please follow-up with your primary care doctor appointment that has been scheduled for you by our social worker.

## 2019-07-13 NOTE — ED Provider Notes (Signed)
[facility]-EMERGENCY DEPT Provider Note   CSN: [id number] Arrival date & time: 07/13/19  1353     History Chief Complaint  Patient presents with  . Seizures    Jason Davenport is a 29 y.o. male.  HPI  Patient is a 29 year old male with a history of seizures.  He states that he has been on Keppra for a long time but is uncertain how long.  Appears it has been several years.  He has no other past medical history apart from frequent left shoulder dislocations.  Patient states that he had a seizure today.  He is unable to give much history because of his altered consciousness.  Per EMS patient had 2 seizures.  Per EMS he had a seizure in route to ED and received 2.5 mg of Versed for the second seizure.  Seizure activity was grand mal, full body tonic-clonic.  Patient did bite his tongue on the right side and had scant bleeding on arrival.  Patient did have some nausea and vomiting X1 with EMS and received 4 mg of Zofran.  Patient somewhat postictal upon arrival.  Denies any pain other than from his tongue.  Denies any chest pain, shortness of breath, nausea or lightheadedness or dizziness or headache.   Patient initially stated that he was taking his Keppra as prescribed however on reevaluation at end of visit he states that he has not been taking Keppra for the past 2 weeks.    Past Medical History:  Diagnosis Date  . Seizure [facility])     Patient Active Problem List   Diagnosis Date Noted  . Seizures (HCC) 09/29/2018  . Anterior dislocation of left shoulder 09/29/2018  . Seizure (HCC) 08/17/2018  . Anterior shoulder dislocation, left, initial encounter 08/17/2018    Past Surgical History:  Procedure Laterality Date  . no past surgeries         Family History  Problem Relation Age of Onset  . Hypertension Mother   . Hypertension Father     Social History   Tobacco Use  . Smoking status: Current Every Day Smoker    Packs/day: 0.25    Types: Cigarettes   . Smokeless tobacco: Never Used  Substance Use Topics  . Alcohol use: No  . Drug use: Yes    Types: Marijuana    Home Medications Prior to Admission medications   Medication Sig Start Date End Date Taking? Authorizing Provider  levETIRAcetam (KEPPRA) 1000 MG tablet Take 1,000 mg by mouth 2 (two) times daily. 06/18/19  Yes [provider]  HYDROcodone-acetaminophen (NORCO/VICODIN) 5-325 MG tablet Take 1-2 tablets by mouth every 6 (six) hours as needed. Patient not taking: Reported on 07/13/2019 10/23/18   Maxwell Caul, PA-C  levETIRAcetam (KEPPRA XR) 500 MG 24 hr tablet Take 2 tablets (1,000 mg total) by mouth at bedtime. 07/13/19   Gailen Shelter, PA  levETIRAcetam (KEPPRA) 500 MG tablet Take 1 tablet (500 mg total) by mouth 2 (two) times daily. Patient not taking: Reported on 07/13/2019 08/18/18   Joseph Art, DO    Allergies    Patient has no known allergies.  Review of Systems   Review of Systems  Constitutional: Negative for chills and fever.  HENT: Negative for congestion.   Eyes: Negative for pain.  Respiratory: Negative for cough and shortness of breath.   Cardiovascular: Negative for chest pain and leg swelling.  Gastrointestinal: Negative for abdominal pain and vomiting.  Genitourinary: Negative for dysuria.  Musculoskeletal: Negative for  myalgias.  Skin: Negative for rash.  Neurological: Positive for seizures. Negative for dizziness and headaches.    Physical Exam Updated Vital Signs BP 119/88   Pulse 89   Temp (!) 96.6 F (35.9 C) (Axillary)   Resp 18   SpO2 100%   Physical Exam Vitals and nursing note reviewed.  Constitutional:      General: He is not in acute distress.    Comments: Patient is somewhat postictal but arousable.  Able answer questions appropriately and follow basic commands.  HENT:     Head: Normocephalic and atraumatic.     Nose: Nose normal.     Mouth/Throat:     Mouth: Mucous membranes are moist.     Comments: Small  stellate laceration to the right side of the mid tongue on the lateral edge. Eyes:     General: No scleral icterus. Cardiovascular:     Rate and Rhythm: Normal rate and regular rhythm.     Pulses: Normal pulses.     Heart sounds: Normal heart sounds.  Pulmonary:     Effort: Pulmonary effort is normal. No respiratory distress.     Breath sounds: No wheezing.  Abdominal:     Palpations: Abdomen is soft.     Tenderness: There is no abdominal tenderness.  Musculoskeletal:     Cervical back: Normal range of motion.     Right lower leg: No edema.     Left lower leg: No edema.  Skin:    General: Skin is warm and dry.     Capillary Refill: Capillary refill takes less than 2 seconds.  Neurological:     Mental Status: He is alert. Mental status is at baseline.     Comments: Alert and oriented to self, place, time and event.   Speech is fluent, clear without dysarthria or dysphasia.   Strength 5/5 in upper/lower extremities  Sensation intact in upper/lower extremities   Normal gait.  Negative Romberg. No pronator drift.  Normal finger-to-nose and feet tapping.  CN I not tested  CN II grossly intact visual fields bilaterally. Did not visualize posterior eye.   CN III, IV, VI PERRLA and EOMs intact bilaterally  CN V Intact sensation to sharp and light touch to the face  CN VII facial movements symmetric  CN VIII not tested  CN IX, X no uvula deviation, symmetric rise of soft palate  CN XI 5/5 SCM and trapezius strength bilaterally  CN XII Midline tongue protrusion, symmetric L/R movements   Psychiatric:        Mood and Affect: Mood normal.        Behavior: Behavior normal.     ED Results / Procedures / Treatments   Labs (all labs ordered are listed, but only abnormal results are displayed) Labs Reviewed  CBC WITH DIFFERENTIAL/PLATELET - Abnormal; Notable for the following components:      Result Value   WBC 19.1 (*)    Platelets 412 (*)    Neutro Abs 17.2 (*)    Abs  Immature Granulocytes 0.12 (*)    All other components within normal limits  COMPREHENSIVE METABOLIC PANEL - Abnormal; Notable for the following components:   Potassium 5.4 (*)    Glucose, Bld 139 (*)    Creatinine, Ser 1.50 (*)    Total Protein 9.0 (*)    AST 48 (*)    All other components within normal limits  ETHANOL  LEVETIRACETAM LEVEL    EKG None  Radiology DG Chest Portable 1  View  Result Date: 07/13/2019 CLINICAL DATA:  Left shoulder pain, recent seizure activity EXAM: PORTABLE CHEST 1 VIEW COMPARISON:  01/30/2018 FINDINGS: The heart size and mediastinal contours are within normal limits. Both lungs are clear. The visualized skeletal structures are unremarkable. IMPRESSION: No active disease. Electronically Signed   By: Inez Catalina M.D.   On: 07/13/2019 15:20   DG Shoulder Left  Result Date: 07/13/2019 CLINICAL DATA:  Seizure, left shoulder pain EXAM: LEFT SHOULDER - 2+ VIEW COMPARISON:  02/08/2019 FINDINGS: Frontal and transscapular views of the left shoulder demonstrate no evidence of dislocation. No acute displaced fracture. 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 Glenohumeral osteoarthritis is noted. The left chest is clear. IMPRESSION: 1. Normal alignment of the glenohumeral joint. 2. Chronic sequela from previous shoulder dislocation. No acute fracture. Electronically Signed   By: Randa Ngo M.D.   On: 07/13/2019 16:32    Procedures Procedures (including critical care time)  Medications Ordered in ED Medications  sodium zirconium cyclosilicate (LOKELMA) packet 5 g (has no administration in time range)  levETIRAcetam (KEPPRA) IVPB 1000 mg/100 mL premix (0 mg Intravenous Stopped 07/13/19 1807)  sodium chloride 0.9 % bolus 1,000 mL (1,000 mLs Intravenous New Bag/Given (Non-Interop) 07/13/19 1741)    ED Course  I have reviewed the triage vital signs and the nursing notes.  Pertinent labs & imaging results that were available during my care  of the patient were reviewed by me and considered in my medical decision making (see chart for details).  Clinical Course as of Jul 13 1819  Thu Jul 13, 2019  1641 Plain film of left shoulder is without dislocation.  Chest x-ray reviewed by myself.  There is no sign of infiltrate. Agree with radiology read   [WF]  1728 CMP with potassium 5.4 patient will have follow-up EKG and 1 dose of Lokelma.  Creatinine is at baseline for patient   [WF]  1729 CBC with leukocytosis this is likely secondary to his seizure.  No infectious symptoms today.   [WF]  1820 EKG independently viewed myself.  There is no QT prolongation.  There is no hyperacute T's or evidence of ischemia.   [WF]    Clinical Course User Index [WF] Tedd Sias, [place]   I discussed this case with my attending physician who cosigned this note including patient's presenting symptoms, physical exam, and planned diagnostics and interventions. Attending physician stated agreement with plan or made changes to plan which were implemented.   Plan to provide patient 1 with 1 dose of Lokelma, IV load patient with Keppra and give 1 L normal saline.  Patient will follow up with PCP.  I consulted transition of care team to ensure PCP follow-up is arranged as well as for medication assistance as there is questionable inability to afford his Keppra.  Patient has generally poor insight and is unable to explain why he is not taking his medication.  Long patient education discussion was had explaining sequela of the likely future seizures patient will have if he is continues to be noncompliant with his Keppra.  He understands that this could result in traumatic brain injury, intracranial hemorrhage and other morbidities.  Patient is well-appearing at this time is no longer Postictal.  Is completely neurologically intact with vital signs within normal months.  Will discharge  He is tolerating p.o.  Keppra sent to his pharmacy.  6:20 PM TOC team will  follow up tomorrow and schedule a FU appointment for patient.  MDM Rules/Calculators/A&P                      Final Clinical Impression(s) / ED Diagnoses Final diagnoses:  Seizure [facility])    Rx / DC Orders ED Discharge Orders         Ordered    levETIRAcetam (KEPPRA XR) 500 MG 24 hr tablet  Daily at bedtime     07/13/19 1725           Solon [place], [place] 07/13/19 1821    Alvira Monday, MD 07/14/19 1520

## 2019-07-13 NOTE — ED Triage Notes (Signed)
Per EMS, pt had 2 seizures today. Last seizure was just before arriving to ED with EMS. Pt received 2.5mg  versed with second seizure. Pt had 1 minute, grand mal seizure. Pt appears to have bitten tongue, blood around mouth. Pt has hx of seizures, pt not compliant with Keppra. Pt vomited with EMS when getting in truck, he was given 4mg  zofran.   CBG 112 BP 132/96 HR 74 SpO2 99%

## 2019-07-14 NOTE — Progress Notes (Signed)
07/14/2019 236 pm TOC CM attempted call to pt's and mailbox was full. Appt arranged at [facility] on 08/14/2019 at 230 pm. Isidoro Donning RN CCM, [facility] ED TOC CM [phone]

## 2019-07-18 LAB — LEVETIRACETAM LEVEL: Levetiracetam Lvl: <1 ug/mL — ABNORMAL LOW (ref 10.0–40.0)

## 2019-08-14 ENCOUNTER — Ambulatory Visit: Payer: Self-pay | Admitting: Nurse Practitioner

## 2019-08-17 ENCOUNTER — Ambulatory Visit (INDEPENDENT_AMBULATORY_CARE_PROVIDER_SITE_OTHER): Payer: 59 | Admitting: Neurology

## 2019-08-17 ENCOUNTER — Other Ambulatory Visit: Payer: Self-pay

## 2019-08-17 ENCOUNTER — Encounter: Payer: Self-pay | Admitting: Neurology

## 2019-08-17 VITALS — BP 114/77 | HR 86 | Ht 72.0 in | Wt 155.0 lb

## 2019-08-17 DIAGNOSIS — G40009 Localization-related (focal) (partial) idiopathic epilepsy and epileptic syndromes with seizures of localized onset, not intractable, without status epilepticus: Secondary | ICD-10-CM

## 2019-08-17 MED ORDER — LEVETIRACETAM ER 500 MG PO TB24: ORAL_TABLET | ORAL | 11 refills | Status: DC

## 2019-08-17 NOTE — Patient Instructions (Addendum)
1. Continue Keppra XR 500mg : Take 1 tablet twice a day  2. Follow-up in 4 months, call for any changes  Seizure Precautions: 1. If medication has been prescribed for you to prevent seizures, take it exactly as directed.  Do not stop taking the medicine without talking to your doctor first, even if you have not had a seizure in a long time.   2. Avoid activities in which a seizure would cause danger to yourself or to others.  Don't operate dangerous machinery, swim alone, or climb in high or dangerous places, such as on ladders, roofs, or girders.  Do not drive unless your doctor says you may.  3. If you have any warning that you may have a seizure, lay down in a safe place where you can't hurt yourself.    4.  No driving for 6 months from last seizure, as per [facility].   Please refer to the following link on the [employer]'s website for more information: http://www.epilepsyfoundation.org/answerplace/Social/driving/drivingu.cfm   5.  Maintain good sleep hygiene. Avoid alcohol.  6.  Contact your doctor if you have any problems that may be related to the medicine you are taking.  7.  Call 911 and bring the patient back to the ED if:        A.  The seizure lasts longer than 5 minutes.       B.  The patient doesn't awaken shortly after the seizure  C.  The patient has new problems such as difficulty seeing, speaking or moving  D.  The patient was injured during the seizure  E.  The patient has a temperature over 102 F (39C)  F.  The patient vomited and now is having trouble breathing

## 2019-08-17 NOTE — Progress Notes (Signed)
NEUROLOGY CONSULTATION NOTE  Jason Davenport MRN: [id number] DOB: 11/10/1990  Referring provider: Dr. Jones Broom Primary care provider: none listed  Reason for consult:  seizures  Dear Dr Ave Filter:  Thank you for your kind referral of Jason Davenport for consultation of the above symptoms. Although his history is well known to you, please allow me to reiterate it for the purpose of our medical record. He is alone in the office today. Records and images were personally reviewed where available.   HISTORY OF PRESENT ILLNESS: This is a pleasant 29 year old right-handed man presenting to establish care for seizures. His first seizure in 01/2018 was a nocturnal seizure. He had another seizure in 08/2018, then 09/2018 and was started on Levetiracetam 500mg  BID at that time. He had a left shoulder dislocation and has had issues with this since then. He usually goes to the ER when he has a seizure and was in the ER on 02/08/19, then 06/17/19, and most recently 07/13/19 when he had 2 seizures in one day. With the seizures in May, he had stopped his Keppra previously. He states he just forgot to take it. He has been taking Levetiracetam ER 500mg  BID since May and denies any further seizures. He recalls taking 1000mg  BID at one point but he was not feeling himself. With the seizures, he usually feels like he is getting dehydrated, sweating really hard and feeling lightheaded, then having a GTC. Witnesses have reported he stares off first before a convulsion. He has bitten his tongue and dislocated left shoulder again with the seizures. He states surgery is on hold until seizures are controlled. He denies any gaps in time, olfactory/gustatory hallucinations, deja vu, rising epigastric sensation, focal numbness/tingling/weakness, myoclonic jerks. No headaches, diplopia, dysarthria/dysphagia, neck/back pain, bowel/bladder dysfunction. Memory is good. He denies drinking alcohol and denies any sleep difficulties. Mood is  good. He is currently unemployed.   Epilepsy Risk Factors: He had a normal birth and early development.  There is no history of febrile convulsions, CNS infections such as meningitis/encephalitis, significant traumatic brain injury, neurosurgical procedures, or family history of seizures.  Diagnostic Data: EEGs: normal wake EEG in 08/2018 MRI: I personally reviewed MRI brain with and without contrast done 08/2018 which did not show any acute changes. Hippocampi symmetric with no abnormal signal or enhancement seen.   PAST MEDICAL HISTORY: Past Medical History:  Diagnosis Date  . Seizure (HCC)     PAST SURGICAL HISTORY: Past Surgical History:  Procedure Laterality Date  . no past surgeries      MEDICATIONS: Current Outpatient Medications on File Prior to Visit  Medication Sig Dispense Refill  . levETIRAcetam (KEPPRA XR) 500 MG 24 hr tablet Take 2 tablets (1,000 mg total) by mouth at bedtime. 90 tablet 1   No current facility-administered medications on file prior to visit.    ALLERGIES: No Known Allergies  FAMILY HISTORY: Family History  Problem Relation Age of Onset  . Hypertension Mother   . Hypertension Father     SOCIAL HISTORY: Social History   Socioeconomic History  . Marital status: Single    Spouse name: Not on file  . Number of children: 1  . Years of education: some college  . Highest education level: Not on file  Occupational History  . Occupation: Not working right now  Tobacco Use  . Smoking status: Current Every Day Smoker    Packs/day: 0.25    Types: Cigarettes  . Smokeless tobacco: Never Used  Vaping Use  .  Vaping Use: Never used  Substance and Sexual Activity  . Alcohol use: No  . Drug use: Yes    Types: Marijuana  . Sexual activity: Not on file  Other Topics Concern  . Not on file  Social History Narrative   Right-handed.   3-4 cups caffeine per day.   Lives at home alone.   Social Determinants of Health   Financial Resource  Strain:   . Difficulty of Paying Living Expenses:   Food Insecurity:   . Worried About Programme researcher, broadcasting/film/video in the Last Year:   . Barista in the Last Year:   Transportation Needs:   . Freight forwarder (Medical):   Marland Kitchen Lack of Transportation (Non-Medical):   Physical Activity:   . Days of Exercise per Week:   . Minutes of Exercise per Session:   Stress:   . Feeling of Stress :   Social Connections:   . Frequency of Communication with Friends and Family:   . Frequency of Social Gatherings with Friends and Family:   . Attends Religious Services:   . Active Member of Clubs or Organizations:   . Attends Banker Meetings:   Marland Kitchen Marital Status:   Intimate Partner Violence:   . Fear of Current or Ex-Partner:   . Emotionally Abused:   Marland Kitchen Physically Abused:   . Sexually Abused:     PHYSICAL EXAM: Vitals:   08/17/19 1345  BP: 114/77  Pulse: 86  SpO2: 97%   General: No acute distress Head:  Normocephalic/atraumatic Skin/Extremities: No rash, no edema Neurological Exam: Mental status: alert and oriented to person, place, and time, no dysarthria or aphasia, Fund of knowledge is appropriate.  Recent and remote memory are intact. 3/3 delayed recall. Attention and concentration are normal.   Cranial nerves: CN I: not tested CN II: pupils equal, round and reactive to light, visual fields intact CN III, IV, VI:  full range of motion, no nystagmus, no ptosis CN V: facial sensation intact CN VII: upper and lower face symmetric CN VIII: hearing intact to conversation Bulk & Tone: normal, no fasciculations. Motor: 5/5 throughout with no pronator drift. Sensation: intact to light touch, cold.  No extinction to double simultaneous stimulation.  Romberg test negative Deep Tendon Reflexes: +2 on both UE, +1 both LE, no ankle clonus Cerebellar: no incoordination on finger to nose testing Gait: narrow-based and steady, able to tandem walk adequately. Tremor:  none   IMPRESSION: This is a pleasant 29 year old right-handed man with a history of seizures since 2019. 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 MRI brain and routine EEG normal. It appears he mostly has seizures when he misses medications. We discussed the importance of medication compliance, continue Levetiracetam ER 500mg  BID. He had side effects on higher dose, if seizures continue without any triggers, we will add on another AED. We discussed the natural history and prognosis of seizures, there is unpredictability to seizures and we will try to control it as much as we can, however seizure freedom as a requirement prior to shoulder surgery should be discussed with his surgeon, as seizures are difficult to predict. [place] driving laws were discussed with the patient, and he knows to stop driving after a seizure, until 6 months seizure-free. Follow-up in 4 months, he knows to call for any changes.   Thank you for allowing me to participate in the care of this patient. Please do not hesitate to call for any  questions or concerns.   Patrcia Dolly, M.D.  CC: Dr. Ave Filter

## 2019-08-18 ENCOUNTER — Ambulatory Visit: Payer: Self-pay | Admitting: Neurology

## 2019-12-18 ENCOUNTER — Encounter: Payer: Self-pay | Admitting: Neurology

## 2019-12-18 ENCOUNTER — Ambulatory Visit: Payer: 59 | Admitting: Neurology

## 2019-12-18 DIAGNOSIS — Z029 Encounter for administrative examinations, unspecified: Secondary | ICD-10-CM

## 2020-02-23 ENCOUNTER — Other Ambulatory Visit: Payer: Self-pay | Admitting: Orthopedic Surgery

## 2020-02-27 NOTE — Progress Notes (Signed)
Neurology notes reviewed with Dr Armond Hang. Pt ok for [facility]- if compliant with meds. Spoke with the pt and he is unable to get the time off of work, and is awaiting  covid test results from [employer]. He states he needs to postpone the surgery. Spoke with Darel Hong at Dr Selina Cooley office and let her know.

## 2020-02-29 ENCOUNTER — Other Ambulatory Visit (HOSPITAL_COMMUNITY): Payer: 59

## 2020-03-04 ENCOUNTER — Encounter (HOSPITAL_BASED_OUTPATIENT_CLINIC_OR_DEPARTMENT_OTHER): Admission: RE | Payer: Self-pay | Source: Home / Self Care

## 2020-03-04 ENCOUNTER — Ambulatory Visit (HOSPITAL_BASED_OUTPATIENT_CLINIC_OR_DEPARTMENT_OTHER): Admission: RE | Admit: 2020-03-04 | Payer: 59 | Source: Home / Self Care | Admitting: Orthopedic Surgery

## 2020-03-04 SURGERY — SHOULDER ARTHROSCOPY WITH BANKART REPAIR
Anesthesia: Choice | Site: Shoulder | Laterality: Left

## 2020-05-02 ENCOUNTER — Emergency Department (HOSPITAL_COMMUNITY): Payer: 59

## 2020-05-02 ENCOUNTER — Emergency Department (HOSPITAL_COMMUNITY)
Admission: EM | Admit: 2020-05-02 | Discharge: 2020-05-02 | Disposition: A | Discharge status: Home / Self Care | Payer: 59 | Attending: Emergency Medicine | Admitting: Emergency Medicine

## 2020-05-02 DIAGNOSIS — Z79899 Other long term (current) drug therapy: Secondary | ICD-10-CM | POA: Diagnosis not present

## 2020-05-02 DIAGNOSIS — G40909 Epilepsy, unspecified, not intractable, without status epilepticus: Secondary | ICD-10-CM | POA: Insufficient documentation

## 2020-05-02 DIAGNOSIS — F17210 Nicotine dependence, cigarettes, uncomplicated: Secondary | ICD-10-CM | POA: Diagnosis not present

## 2020-05-02 DIAGNOSIS — S43004A Unspecified dislocation of right shoulder joint, initial encounter: Secondary | ICD-10-CM | POA: Diagnosis not present

## 2020-05-02 DIAGNOSIS — W19XXXA Unspecified fall, initial encounter: Secondary | ICD-10-CM | POA: Diagnosis not present

## 2020-05-02 DIAGNOSIS — S4991XA Unspecified injury of right shoulder and upper arm, initial encounter: Secondary | ICD-10-CM | POA: Diagnosis present

## 2020-05-02 MED ORDER — LEVETIRACETAM IN NACL 1000 MG/100ML IV SOLN
1000.0000 mg | Freq: Once | INTRAVENOUS | Status: AC
  Administered 2020-05-02: 1000 mg via INTRAVENOUS
  Filled 2020-05-02: qty 100

## 2020-05-02 MED ORDER — MIDAZOLAM HCL 2 MG/2ML IJ SOLN
INTRAMUSCULAR | Status: AC | PRN
  Administered 2020-05-02: 1 mg via INTRAVENOUS

## 2020-05-02 MED ORDER — LEVETIRACETAM ER 500 MG PO TB24: ORAL_TABLET | ORAL | 0 refills | Status: DC

## 2020-05-02 MED ORDER — ETOMIDATE 2 MG/ML IV SOLN
INTRAVENOUS | Status: AC | PRN
  Administered 2020-05-02: 10 mg via INTRAVENOUS

## 2020-05-02 MED ORDER — ETOMIDATE 2 MG/ML IV SOLN: 10.0000 mg | Freq: Once | INTRAVENOUS | Status: DC

## 2020-05-02 MED ORDER — MORPHINE SULFATE (PF) 4 MG/ML IV SOLN
4.0000 mg | Freq: Once | INTRAVENOUS | Status: AC
  Administered 2020-05-02: 4 mg via INTRAVENOUS
  Filled 2020-05-02: qty 1

## 2020-05-02 MED ORDER — MIDAZOLAM HCL 2 MG/2ML IJ SOLN
2.0000 mg | Freq: Once | INTRAMUSCULAR | Status: DC
  Filled 2020-05-02: qty 2

## 2020-05-02 MED ORDER — MIDAZOLAM HCL 5 MG/5ML IJ SOLN
INTRAMUSCULAR | Status: AC | PRN
  Administered 2020-05-02: 1 mg via INTRAVENOUS

## 2020-05-02 NOTE — ED Provider Notes (Signed)
[facility] EMERGENCY DEPARTMENT Provider Note   CSN: [id number] Arrival date & time: 05/02/20  1323     History Chief Complaint  Patient presents with  . Dislocation    Jason Davenport is a 30 y.o. male.  Pt presents to the ED today with a right shoulder dislocation.  Pt has a hx of a left shoulder dislocation in the past, but not right.  The pt said he thinks he slept wrong.  Pt is unsure if he had a seizure or not.  He has not been taking his seizure medication.        Past Medical History:  Diagnosis Date  . Seizure [facility])     Patient Active Problem List   Diagnosis Date Noted  . Seizures (HCC) 09/29/2018  . Anterior dislocation of left shoulder 09/29/2018  . Seizure (HCC) 08/17/2018  . Anterior shoulder dislocation, left, initial encounter 08/17/2018    Past Surgical History:  Procedure Laterality Date  . no past surgeries         Family History  Problem Relation Age of Onset  . Hypertension Mother   . Hypertension Father     Social History   Tobacco Use  . Smoking status: Current Every Day Smoker    Packs/day: 0.25    Types: Cigarettes  . Smokeless tobacco: Never Used  Vaping Use  . Vaping Use: Never used  Substance Use Topics  . Alcohol use: No  . Drug use: Yes    Types: Marijuana    Home Medications Prior to Admission medications   Medication Sig Start Date End Date Taking? Authorizing Provider  levETIRAcetam (KEPPRA XR) 500 MG 24 hr tablet Take 1 tablet twice a day 05/02/20   Jacalyn Lefevre, MD    Allergies    Patient has no known allergies.  Review of Systems   Review of Systems  Musculoskeletal:       Right shoulder pain  Neurological: Positive for seizures.  All other systems reviewed and are negative.   Physical Exam Updated Vital Signs BP (!) 137/94 (BP Location: Right Arm)   Pulse 83   Temp 98.5 F (36.9 C)   Resp 16   SpO2 100%   Physical Exam Vitals and nursing note reviewed.  Constitutional:       Appearance: Normal appearance.  HENT:     Head: Normocephalic and atraumatic.     Right Ear: External ear normal.     Left Ear: External ear normal.     Nose: Nose normal.     Mouth/Throat:     Mouth: Mucous membranes are moist.     Pharynx: Oropharynx is clear.  Eyes:     Extraocular Movements: Extraocular movements intact.     Conjunctiva/sclera: Conjunctivae normal.     Pupils: Pupils are equal, round, and reactive to light.  Cardiovascular:     Rate and Rhythm: Normal rate and regular rhythm.     Pulses: Normal pulses.     Heart sounds: Normal heart sounds.  Pulmonary:     Effort: Pulmonary effort is normal.     Breath sounds: Normal breath sounds.  Abdominal:     General: Abdomen is flat. Bowel sounds are normal.     Palpations: Abdomen is soft.  Musculoskeletal:     Cervical back: Normal range of motion and neck supple.     Comments: D/l right shoulder  Skin:    General: Skin is warm.     Capillary Refill: Capillary refill takes less  than 2 seconds.  Neurological:     General: No focal deficit present.     Mental Status: He is alert and oriented to person, place, and time.  Psychiatric:        Mood and Affect: Mood normal.        Behavior: Behavior normal.        Thought Content: Thought content normal.        Judgment: Judgment normal.     ED Results / Procedures / Treatments   Labs (all labs ordered are listed, but only abnormal results are displayed) Labs Reviewed - No data to display  EKG None  Radiology DG Shoulder 1 View Right  Result Date: 05/02/2020 CLINICAL DATA:  Right shoulder pain. EXAM: RIGHT SHOULDER - 1 VIEW COMPARISON:  None. FINDINGS: Single portable view of the right shoulder shows humeral head dislocation, likely anterior. Acromioclavicular and coracoclavicular distances are preserved. IMPRESSION: Humeral head dislocation. Electronically Signed   By: Kennith Center M.D.   On: 05/02/2020 14:35   DG Shoulder Right Portable  Result Date:  05/02/2020 CLINICAL DATA:  RIGHT shoulder dislocation post reduction EXAM: PORTABLE RIGHT SHOULDER COMPARISON:  Portable AP single view 1427 hours compared to earlier study of 05/02/2020 FINDINGS: Previously identified RIGHT glenohumeral dislocation appears reduced on single AP view. No orthogonal view for correlation. Hill-Sachs impaction deformity of proximal RIGHT humerus present. Osseous mineralization normal. AC joint alignment normal. IMPRESSION: Apparent reduction of previously identified RIGHT glenohumeral dislocation on single AP view. Hill-Sachs impaction deformity of proximal RIGHT humerus. Electronically Signed   By: Ulyses Southward M.D.   On: 05/02/2020 14:53    Procedures .Sedation  Date/Time: 05/02/2020 3:15 PM Performed by: Jacalyn Lefevre, MD Authorized by: Jacalyn Lefevre, MD   Consent:    Consent obtained:  Written   Consent given by:  Patient   Alternatives discussed:  Analgesia without sedation Universal protocol:    Immediately prior to procedure, a time out was called: yes     Patient identity confirmed:  Verbally with patient Indications:    Procedure performed:  Dislocation reduction   Procedure necessitating sedation performed by:  Physician performing sedation Pre-sedation assessment:    Time since last food or drink:  Last night   ASA classification: class 1 - normal, healthy patient     Mallampati score:  I - soft palate, uvula, fauces, pillars visible   Pre-sedation assessments completed and reviewed: airway patency, cardiovascular function, hydration status, mental status, nausea/vomiting, pain level, respiratory function and temperature   Immediate pre-procedure details:    Reassessment: Patient reassessed immediately prior to procedure     Verified: bag valve mask available, emergency equipment available, intubation equipment available, IV patency confirmed, oxygen available and reversal medications available   Procedure details (see MAR for exact dosages):     Preoxygenation:  Nasal cannula   Sedation:  Etomidate and midazolam   Intended level of sedation: deep   Analgesia:  Morphine   Intra-procedure monitoring:  Blood pressure monitoring, cardiac monitor, continuous capnometry, continuous pulse oximetry, frequent LOC assessments and frequent vital sign checks   Intra-procedure events: none     Total Provider sedation time (minutes):  30 Post-procedure details:    Post-sedation assessment completed:  05/02/2020 3:16 PM   Attendance: Constant attendance by certified staff until patient recovered     Recovery: Patient returned to pre-procedure baseline     Post-sedation assessments completed and reviewed: airway patency, cardiovascular function, hydration status, mental status, nausea/vomiting, pain level, respiratory function and temperature  Patient is stable for discharge or admission: yes     Procedure completion:  Tolerated well, no immediate complications Reduction of dislocation  Date/Time: 05/02/2020 3:17 PM Performed by: Jacalyn Lefevre, MD Authorized by: Jacalyn Lefevre, MD  Consent: Written consent obtained. Risks and benefits: risks, benefits and alternatives were discussed Consent given by: patient Patient understanding: patient states understanding of the procedure being performed Patient identity confirmed: verbally with patient Time out: Immediately prior to procedure a "time out" was called to verify the correct patient, procedure, equipment, support staff and site/side marked as required. Local anesthesia used: no  Anesthesia: Local anesthesia used: no  Sedation: Patient sedated: yes Sedation type: moderate (conscious) sedation Sedatives: etomidate and midazolam Analgesia: morphine Vitals: Vital signs were monitored during sedation.  Patient tolerance: patient tolerated the procedure well with no immediate complications      Medications Ordered in ED Medications  etomidate (AMIDATE) injection 10 mg (10 mg  Intravenous Not Given 05/02/20 1420)  midazolam (VERSED) injection 2 mg (2 mg Intravenous Not Given 05/02/20 1420)  morphine 4 MG/ML injection 4 mg (4 mg Intravenous Given 05/02/20 1359)  levETIRAcetam (KEPPRA) IVPB 1000 mg/100 mL premix (1,000 mg Intravenous New Bag/Given 05/02/20 1419)  midazolam (VERSED) injection (1 mg Intravenous Given 05/02/20 1411)  etomidate (AMIDATE) injection (10 mg Intravenous Given 05/02/20 1412)  midazolam (VERSED) 5 MG/5ML injection (1 mg Intravenous Given 05/02/20 1415)    ED Course  I have reviewed the triage vital signs and the nursing notes.  Pertinent labs & imaging results that were available during my care of the patient were reviewed by me and considered in my medical decision making (see chart for details).    MDM Rules/Calculators/A&P                         Initial X-ray confirmed dislocation.  Pt signed consent for sedation.  Shoulder now in place.  Pt had some shaking after sedation.  Possible seizure.  Therefore, he was given 1 mg of versed and given IV keppra.  Pt is now awake.  He has very little insight about his seizures.  He has not seen neurology.  He is referred to neurology and given a refill on his keppra.   Pt is to f/u with Dr. Ave Filter who he's seen in the past for his left shoulder.  Pt is stable for d/c.  Return if worse.    Final Clinical Impression(s) / ED Diagnoses Final diagnoses:  Dislocation of right shoulder joint, initial encounter  Seizure disorder (HCC)    Rx / DC Orders ED Discharge Orders         Ordered    levETIRAcetam (KEPPRA XR) 500 MG 24 hr tablet        05/02/20 1508    Ambulatory referral to Neurology       Comments: An appointment is requested in approximately: 1   05/02/20 1509           Jacalyn Lefevre, MD 05/02/20 [phone]

## 2020-05-02 NOTE — ED Triage Notes (Signed)
Pt states he woke up this morning with dislocated shoulder. Pt had near syncopal episode in triage where tech had to catch patient from falling.Pt became very diaphoretic.

## 2020-05-02 NOTE — ED Notes (Signed)
Patient Alert and oriented to baseline. Stable and ambulatory to baseline. Patient verbalized understanding of the discharge instructions.  Patient belongings were taken by the patient.   

## 2020-10-22 ENCOUNTER — Emergency Department (HOSPITAL_COMMUNITY): Payer: 59

## 2020-10-22 ENCOUNTER — Other Ambulatory Visit: Payer: Self-pay

## 2020-10-22 ENCOUNTER — Emergency Department (HOSPITAL_COMMUNITY)
Admission: EM | Admit: 2020-10-22 | Discharge: 2020-10-22 | Disposition: A | Discharge status: Home / Self Care | Payer: 59 | Attending: Emergency Medicine | Admitting: Emergency Medicine

## 2020-10-22 DIAGNOSIS — R5383 Other fatigue: Secondary | ICD-10-CM | POA: Diagnosis not present

## 2020-10-22 DIAGNOSIS — W010XXA Fall on same level from slipping, tripping and stumbling without subsequent striking against object, initial encounter: Secondary | ICD-10-CM | POA: Insufficient documentation

## 2020-10-22 DIAGNOSIS — R569 Unspecified convulsions: Secondary | ICD-10-CM | POA: Insufficient documentation

## 2020-10-22 DIAGNOSIS — S0990XA Unspecified injury of head, initial encounter: Secondary | ICD-10-CM

## 2020-10-22 DIAGNOSIS — F17210 Nicotine dependence, cigarettes, uncomplicated: Secondary | ICD-10-CM | POA: Diagnosis not present

## 2020-10-22 DIAGNOSIS — R000 Tachycardia, unspecified: Secondary | ICD-10-CM | POA: Insufficient documentation

## 2020-10-22 DIAGNOSIS — R7309 Other abnormal glucose: Secondary | ICD-10-CM | POA: Insufficient documentation

## 2020-10-22 DIAGNOSIS — S0003XA Contusion of scalp, initial encounter: Secondary | ICD-10-CM | POA: Insufficient documentation

## 2020-10-22 LAB — CBC
HCT: 49.5 % (ref 39.0–52.0)
Hemoglobin: 15.6 g/dL (ref 13.0–17.0)
MCH: 30.3 pg (ref 26.0–34.0)
MCHC: 31.5 g/dL (ref 30.0–36.0)
MCV: 96.1 fL (ref 80.0–100.0)
Platelets: 374 10*3/uL (ref 150–400)
RBC: 5.15 MIL/uL (ref 4.22–5.81)
RDW: 12.3 % (ref 11.5–15.5)
WBC: 12 10*3/uL — ABNORMAL HIGH (ref 4.0–10.5)
nRBC: 0 % (ref 0.0–0.2)

## 2020-10-22 LAB — BASIC METABOLIC PANEL
Anion gap: 20 — ABNORMAL HIGH (ref 5–15)
BUN: 13 mg/dL (ref 6–20)
CO2: 14 mmol/L — ABNORMAL LOW (ref 22–32)
Calcium: 9.9 mg/dL (ref 8.9–10.3)
Chloride: 106 mmol/L (ref 98–111)
Creatinine, Ser: 1.39 mg/dL — ABNORMAL HIGH (ref 0.61–1.24)
GFR, Estimated: >60 mL/min (ref >60)
Glucose, Bld: 137 mg/dL — ABNORMAL HIGH (ref 70–99)
Potassium: 3.7 mmol/L (ref 3.5–5.1)
Sodium: 140 mmol/L (ref 135–145)

## 2020-10-22 LAB — CBG MONITORING, ED: Glucose-Capillary: 123 mg/dL — ABNORMAL HIGH (ref 70–99)

## 2020-10-22 MED ORDER — ACETAMINOPHEN 500 MG PO TABS
1000.0000 mg | ORAL_TABLET | Freq: Once | ORAL | Status: AC
  Administered 2020-10-22: 1000 mg via ORAL
  Filled 2020-10-22: qty 2

## 2020-10-22 MED ORDER — SODIUM CHLORIDE 0.9 % IV SOLN: 1000.0000 mL | INTRAVENOUS | Status: DC

## 2020-10-22 MED ORDER — SODIUM CHLORIDE 0.9 % IV BOLUS (SEPSIS)
1000.0000 mL | Freq: Once | INTRAVENOUS | Status: AC
  Administered 2020-10-22: 1000 mL via INTRAVENOUS

## 2020-10-22 MED ORDER — LEVETIRACETAM IN NACL 1000 MG/100ML IV SOLN
1000.0000 mg | Freq: Once | INTRAVENOUS | Status: AC
  Administered 2020-10-22: 1000 mg via INTRAVENOUS
  Filled 2020-10-22: qty 100

## 2020-10-22 NOTE — ED Triage Notes (Signed)
Pt reports he had seizures this morning. Pt reports a history of seizures but denies taking any medications.

## 2020-10-22 NOTE — ED Provider Notes (Signed)
  Physical Exam  BP 127/73   Pulse 64   Temp 98.4 F (36.9 C) (Oral)   Resp 13   Ht 6' (1.829 m)   Wt 72.6 kg   SpO2 99%   BMI 21.70 kg/m   Physical Exam Vitals and nursing note reviewed.  Constitutional:      Appearance: He is well-developed.  HENT:     Head: Normocephalic and atraumatic.     Comments: Left orbital swelling, hematoma Eyes:     Conjunctiva/sclera: Conjunctivae normal.  Cardiovascular:     Rate and Rhythm: Normal rate and regular rhythm.     Heart sounds: No murmur heard. Pulmonary:     Effort: Pulmonary effort is normal. No respiratory distress.     Breath sounds: Normal breath sounds.  Abdominal:     Palpations: Abdomen is soft.     Tenderness: There is no abdominal tenderness.  Musculoskeletal:     Cervical back: Neck supple.  Skin:    General: Skin is warm and dry.  Neurological:     Mental Status: He is alert.    ED Course/Procedures   Procedures  MDM  Patient received an handoff after suffering a seizure.  Patient states has been noncompliant with medication because of the side effects of his Keppra.  He states that they alter his personality.  CT head and face unremarkable with no fracture but does show a hematoma.  Patient was loaded with Keppra and discharged with outpatient neurology follow-up.  He was instructed to call his neurologist soon as possible to possibly discuss medication changes and decreased seizure risk.       Glendora Score, MD 10/22/20 [phone]

## 2020-10-22 NOTE — ED Notes (Signed)
Pt had a seizure in lobby and fell to floor hitting his head above left eye, has a hematoma there, possible bite to tongue, blood coming from mouth

## 2020-10-22 NOTE — ED Provider Notes (Signed)
[facility] EMERGENCY DEPARTMENT Provider Note  CSN: [id number] Arrival date & time: 10/22/20 0865  Chief Complaint(s) Seizures  HPI Jason Davenport is a 30 y.o. male with a past medical history listed below who presents by EMS for seizure.  Denied taking medication for seizures. Supposed to be on Keppra per record review. In triage patient had another seizure witnessed by staff.  Generalized tonic-clonic lasting less than a minute.  Resulted in patient falling and hitting his face on the floor.  Patient was brought back immediately to treatment they.  Noted to have a hematoma over the left eyebrow.  Lethargic.  Remainder of history, ROS, and physical exam limited due to patient's condition (altered mental status/postictal).   Level V Caveat.   HPI  Past Medical History Past Medical History:  Diagnosis Date   Seizure [facility])    Patient Active Problem List   Diagnosis Date Noted   Seizures (HCC) 09/29/2018   Anterior dislocation of left shoulder 09/29/2018   Seizure (HCC) 08/17/2018   Anterior shoulder dislocation, left, initial encounter 08/17/2018   Home Medication(s) Prior to Admission medications   Medication Sig Start Date End Date Taking? Authorizing Provider  levETIRAcetam (KEPPRA XR) 500 MG 24 hr tablet Take 1 tablet twice a day 05/02/20   Jason Lefevre, MD                                                                                                                                    Past Surgical History Past Surgical History:  Procedure Laterality Date   no past surgeries     Family History Family History  Problem Relation Age of Onset   Hypertension Mother    Hypertension Father     Social History Social History   Tobacco Use   Smoking status: Every Day    Packs/day: 0.25    Types: Cigarettes   Smokeless tobacco: Never  Vaping Use   Vaping Use: Never used  Substance Use Topics   Alcohol use: No   Drug use: Yes    Types: Marijuana    Allergies Patient has no known allergies.  Review of Systems Review of Systems Unable to obtain due to altered mental status Physical Exam Vital Signs  I have reviewed the triage vital signs Ht 6' (1.829 m)   Wt 72.6 kg   BMI 21.70 kg/m   Physical Exam Vitals reviewed.  Constitutional:      General: He is not in acute distress.    Appearance: He is well-developed. He is not diaphoretic.     Comments: Smells of marijuana  HENT:     Head: Normocephalic. Contusion present.      Nose: Nose normal.  Eyes:     General: No scleral icterus.       Right eye: No discharge.        Left eye: No discharge.     Conjunctiva/sclera: Conjunctivae normal.  Pupils: Pupils are equal, round, and reactive to light.  Cardiovascular:     Rate and Rhythm: Regular rhythm. Tachycardia present.     Heart sounds: No murmur heard.   No friction rub. No gallop.  Pulmonary:     Effort: Pulmonary effort is normal. No respiratory distress.     Breath sounds: Normal breath sounds. No stridor. No rales.  Abdominal:     General: There is no distension.     Palpations: Abdomen is soft.     Tenderness: There is no abdominal tenderness.  Musculoskeletal:        General: No tenderness.     Cervical back: Normal range of motion and neck supple.  Skin:    General: Skin is warm and dry.     Findings: No erythema or rash.  Neurological:     Mental Status: He is lethargic.     GCS: GCS eye subscore is 3. GCS verbal subscore is 2. GCS motor subscore is 5.    ED Results and Treatments Labs (all labs ordered are listed, but only abnormal results are displayed) Labs Reviewed  CBC  BASIC METABOLIC PANEL  CBG MONITORING, ED                                                                                                                         EKG  EKG Interpretation  Date/Time:    Ventricular Rate:    PR Interval:    QRS Duration:   QT Interval:    QTC Calculation:   R Axis:     Text  Interpretation:         Radiology No results found.  Pertinent labs & imaging results that were available during my care of the patient were reviewed by me and considered in my medical decision making (see MDM for details).  Medications Ordered in ED Medications  levETIRAcetam (KEPPRA) IVPB 1000 mg/100 mL premix (has no administration in time range)  sodium chloride 0.9 % bolus 1,000 mL (has no administration in time range)    Followed by  sodium chloride 0.9 % bolus 1,000 mL (has no administration in time range)    Followed by  0.9 %  sodium chloride infusion (has no administration in time range)  Procedures Procedures  (including critical care time)  Medical Decision Making / ED Course I have reviewed the nursing notes for this encounter and the patient's prior records (if available in EHR or on provided paperwork).  Jason Davenport was evaluated in Emergency Department on 10/22/2020 for the symptoms described in the history of present illness. He was evaluated in the context of the global COVID-19 pandemic, which necessitated consideration that the patient might be at risk for infection with the SARS-CoV-2 virus that causes COVID-19. Institutional protocols and algorithms that pertain to the evaluation of patients at risk for COVID-19 are in a state of rapid change based on information released by regulatory bodies including the CDC and federal and state organizations. These policies and algorithms were followed during the patient's care in the ED.     Patient presents after seizure. Had a witnessed seizure here in triage resulting in head trauma. Immediately brought back. Patient localizing to pain moving all extremities. Possibly noncompliant with medication.  Screening labs obtain. Will get CT head given trauma. Will provide with IV fluids. Loaded  with Keppra.   Patient care turned over to omcoming. Patient case and results discussed in detail; please see their note for further ED managment.     Final Clinical Impression(s) / ED Diagnoses Final diagnoses:  Seizure [facility])  Traumatic injury of head, initial encounter     This chart was dictated using voice recognition software.  Despite best efforts to proofread,  errors can occur which can change the documentation meaning.    Nira Conn, MD 10/22/20 [phone]

## 2020-10-22 NOTE — ED Notes (Signed)
Reviewed discharge papers with patient and discharge instructions given. Patient expresses verbal understanding of instructions. All questions and concerns addressed.

## 2020-10-22 NOTE — ED Notes (Signed)
Pt is postictal at this time

## 2020-10-22 NOTE — ED Notes (Signed)
PT WAS IN WAITING ROOM AND DOING REGULAR ACTIVITY, WHEN PT STARTED SHAKING. I WAS IN THE WAITING ROOM AT TIME OF FALL AND WHEELCHAIR WAS TILTING. I RAN OVER TO PT TO TRY AND CATCH PT FALL AND IT HAPPENED SO FAST PT WAS ALREADY ON THE FLOOR, I PROCEEDED TO HOLD C-SPINE AND HOLD PATIENT ON HIS SIDE WHILE SEIZING. SHAKING CONTINUED FOR 45 SECONDS TO 1 MINUTE AND THEN I GOT ASSISTANCE FROM TRIAGE RN AND OTHER STAFF. A BED WAS BROUGHT FOR PT AND PT WAS PUT ON BED AND TAKEN TO THE BACK. I OBSERVED PATIENT HAD A LARGE HEMATOMA TO THE LEFT EYE AND HAD BIT HIS TONGUE, SO HE HAD MINIMAL BLOOD COMING FROM HIS MOUTH AND I MADE THE RN IN THE PODS AWARE.

## 2020-10-22 NOTE — ED Notes (Signed)
Pt ambulates to restroom. Ice pack given for facial swelling.

## 2020-11-13 ENCOUNTER — Other Ambulatory Visit: Payer: Self-pay

## 2020-11-13 ENCOUNTER — Emergency Department (HOSPITAL_COMMUNITY)
Admission: EM | Admit: 2020-11-13 | Discharge: 2020-11-13 | Disposition: A | Discharge status: Home / Self Care | Payer: 59 | Attending: Emergency Medicine | Admitting: Emergency Medicine

## 2020-11-13 DIAGNOSIS — R61 Generalized hyperhidrosis: Secondary | ICD-10-CM | POA: Diagnosis not present

## 2020-11-13 DIAGNOSIS — R569 Unspecified convulsions: Secondary | ICD-10-CM | POA: Diagnosis not present

## 2020-11-13 DIAGNOSIS — R4182 Altered mental status, unspecified: Secondary | ICD-10-CM | POA: Insufficient documentation

## 2020-11-13 DIAGNOSIS — S00512A Abrasion of oral cavity, initial encounter: Secondary | ICD-10-CM | POA: Diagnosis not present

## 2020-11-13 DIAGNOSIS — S0993XA Unspecified injury of face, initial encounter: Secondary | ICD-10-CM | POA: Diagnosis present

## 2020-11-13 DIAGNOSIS — F17210 Nicotine dependence, cigarettes, uncomplicated: Secondary | ICD-10-CM | POA: Insufficient documentation

## 2020-11-13 DIAGNOSIS — X58XXXA Exposure to other specified factors, initial encounter: Secondary | ICD-10-CM | POA: Diagnosis not present

## 2020-11-13 DIAGNOSIS — Z79899 Other long term (current) drug therapy: Secondary | ICD-10-CM | POA: Insufficient documentation

## 2020-11-13 LAB — CBC WITH DIFFERENTIAL/PLATELET
Abs Immature Granulocytes: 0.04 10*3/uL (ref 0.00–0.07)
Basophils Absolute: 0.1 10*3/uL (ref 0.0–0.1)
Basophils Relative: 1 %
Eosinophils Absolute: 0.1 10*3/uL (ref 0.0–0.5)
Eosinophils Relative: 1 %
HCT: 47.7 % (ref 39.0–52.0)
Hemoglobin: 15.1 g/dL (ref 13.0–17.0)
Immature Granulocytes: 0 %
Lymphocytes Relative: 35 %
Lymphs Abs: 3.8 10*3/uL (ref 0.7–4.0)
MCH: 30.3 pg (ref 26.0–34.0)
MCHC: 31.7 g/dL (ref 30.0–36.0)
MCV: 95.8 fL (ref 80.0–100.0)
Monocytes Absolute: 1 10*3/uL (ref 0.1–1.0)
Monocytes Relative: 9 %
Neutro Abs: 5.9 10*3/uL (ref 1.7–7.7)
Neutrophils Relative %: 54 %
Platelets: 393 10*3/uL (ref 150–400)
RBC: 4.98 MIL/uL (ref 4.22–5.81)
RDW: 12.8 % (ref 11.5–15.5)
WBC: 10.9 10*3/uL — ABNORMAL HIGH (ref 4.0–10.5)
nRBC: 0 % (ref 0.0–0.2)

## 2020-11-13 LAB — CBG MONITORING, ED: Glucose-Capillary: 107 mg/dL — ABNORMAL HIGH (ref 70–99)

## 2020-11-13 LAB — BASIC METABOLIC PANEL
Anion gap: 19 — ABNORMAL HIGH (ref 5–15)
BUN: 9 mg/dL (ref 6–20)
CO2: 14 mmol/L — ABNORMAL LOW (ref 22–32)
Calcium: 10.3 mg/dL (ref 8.9–10.3)
Chloride: 107 mmol/L (ref 98–111)
Creatinine, Ser: 1.4 mg/dL — ABNORMAL HIGH (ref 0.61–1.24)
GFR, Estimated: >60 mL/min (ref >60)
Glucose, Bld: 127 mg/dL — ABNORMAL HIGH (ref 70–99)
Potassium: 3.8 mmol/L (ref 3.5–5.1)
Sodium: 140 mmol/L (ref 135–145)

## 2020-11-13 MED ORDER — LAMOTRIGINE 25 MG PO TABS: 25.0000 mg | ORAL_TABLET | Freq: Every day | ORAL | 0 refills | Status: DC

## 2020-11-13 MED ORDER — LEVETIRACETAM ER 500 MG PO TB24: 500.0000 mg | ORAL_TABLET | Freq: Every day | ORAL | 0 refills | Status: DC

## 2020-11-13 MED ORDER — LEVETIRACETAM IN NACL 1500 MG/100ML IV SOLN
1500.0000 mg | Freq: Once | INTRAVENOUS | Status: AC
  Administered 2020-11-13: 1500 mg via INTRAVENOUS
  Filled 2020-11-13: qty 100

## 2020-11-13 MED ORDER — CLONAZEPAM 0.5 MG PO TABS: 0.5000 mg | ORAL_TABLET | Freq: Once | ORAL | 0 refills | Status: DC

## 2020-11-13 MED ORDER — SODIUM CHLORIDE 0.9 % IV SOLN: INTRAVENOUS | Status: DC

## 2020-11-13 NOTE — ED Triage Notes (Signed)
Pt BIB by EMS due to having a witnessed seizure. Per friend, seizure lasted 2 minutes. Per EMS, pt is noncompliant with seizure meds. Pt is a&ox4 at this time.   BP: 124/80 HR: 76 R: 18 O2: 100% RA   CBG 119

## 2020-11-13 NOTE — ED Notes (Addendum)
Upon entering room, patient minimally responsive, and diaphoretic.  Appears to have had another unwitnessed seizure after arrival here and has bitten his tongue, small abrasion on the left of his tongue.  Effie Shy, EDP at bedside as well.  CBG is 107, patient now speaking but somewhat sleepy.

## 2020-11-13 NOTE — ED Provider Notes (Signed)
[facility]-EMERGENCY DEPT Provider Note   CSN: [id number] Arrival date & time: 11/13/20  0998     History Chief Complaint  Patient presents with   Seizures    Jason Davenport is a 30 y.o. male.  HPI Patient presented to the ED via EMS for evaluation of seizure that apparently occurred with an overlay of medication noncompliance.  As I entered the room, the patient was postictal, and diaphoretic appearing to have had a seizure just prior to my entering the room.  This was approximately 7:20 AM.  At this time patient is confused, unable to give history.  Oxygen saturation 96%.  Level 5 caveat-altered mental status    Past Medical History:  Diagnosis Date   Seizure [facility])     Patient Active Problem List   Diagnosis Date Noted   Seizures (HCC) 09/29/2018   Anterior dislocation of left shoulder 09/29/2018   Seizure (HCC) 08/17/2018   Anterior shoulder dislocation, left, initial encounter 08/17/2018    Past Surgical History:  Procedure Laterality Date   no past surgeries         Family History  Problem Relation Age of Onset   Hypertension Mother    Hypertension Father     Social History   Tobacco Use   Smoking status: Every Day    Packs/day: 0.25    Types: Cigarettes   Smokeless tobacco: Never  Vaping Use   Vaping Use: Never used  Substance Use Topics   Alcohol use: No   Drug use: Yes    Types: Marijuana    Home Medications Prior to Admission medications   Medication Sig Start Date End Date Taking? Authorizing Provider  levETIRAcetam (KEPPRA XR) 500 MG 24 hr tablet Take 1 tablet twice a day 05/02/20   Jacalyn Lefevre, MD    Allergies    Patient has no known allergies.  Review of Systems   Review of Systems  Unable to perform ROS: Mental status change   Physical Exam Updated Vital Signs BP 120/75   Pulse 67   SpO2 100%   Physical Exam Vitals and nursing note reviewed.  Constitutional:      Appearance: He is well-developed.  He is not ill-appearing.  HENT:     Head: Normocephalic and atraumatic.     Right Ear: External ear normal.     Left Ear: External ear normal.     Mouth/Throat:     Mouth: Mucous membranes are moist.     Comments: Tongue abrasion, bleeding slightly.  Bloody saliva on face. Eyes:     Conjunctiva/sclera: Conjunctivae normal.     Pupils: Pupils are equal, round, and reactive to light.  Neck:     Trachea: Phonation normal.  Cardiovascular:     Rate and Rhythm: Normal rate and regular rhythm.     Heart sounds: Normal heart sounds.  Pulmonary:     Effort: Pulmonary effort is normal. No respiratory distress.     Breath sounds: Normal breath sounds. No stridor.  Abdominal:     Palpations: Abdomen is soft.     Tenderness: There is no abdominal tenderness.  Musculoskeletal:        General: Normal range of motion.     Cervical back: Normal range of motion and neck supple.  Skin:    General: Skin is warm and dry.     Coloration: Skin is not jaundiced or pale.  Neurological:     Mental Status: He is alert.     Cranial Nerves:  No cranial nerve deficit.     Motor: No abnormal muscle tone.     Coordination: Coordination normal.     Comments: No focal asymmetry of muscle tone  Psychiatric:     Comments: Obtunded    ED Results / Procedures / Treatments   Labs (all labs ordered are listed, but only abnormal results are displayed) Labs Reviewed - No data to display  EKG None  Radiology No results found.  Procedures .Critical Care Performed by: Mancel Bale, MD Authorized by: Mancel Bale, MD   Critical care provider statement:    Critical care time (minutes):  50   Critical care start time:  11/13/2020 7:20 AM   Critical care end time:  11/13/2020 12:02 PM   Critical care time was exclusive of:  Separately billable procedures and treating other patients   Critical care was necessary to treat or prevent imminent or life-threatening deterioration of the following conditions:   CNS failure or compromise   Critical care was time spent personally by me on the following activities:  Blood draw for specimens, development of treatment plan with patient or surrogate, discussions with consultants, evaluation of patient's response to treatment, examination of patient, obtaining history from patient or surrogate, ordering and performing treatments and interventions, ordering and review of laboratory studies, pulse oximetry, re-evaluation of patient's condition, review of old charts and ordering and review of radiographic studies   Medications Ordered in ED Medications - No data to display  ED Course  I have reviewed the triage vital signs and the nursing notes.  Pertinent labs & imaging results that were available during my care of the patient were reviewed by me and considered in my medical decision making (see chart for details).  Clinical Course as of 11/14/20 1656  Wed Nov 13, 2020  0722 Diaphoretic, confused. Blood on mouth. [EW]  [id number] He is awake and alert and states he stopped his medications several months ago because they made him too sleepy.  He describes problems with the levetiracetam, 500 mg dose being the culprit.  He wants to be changed to 250 mg dose. [EW]  1130 Case discussed with on-call neuro hospitalist, regarding patient's reluctance to continue using Keppra because of fatigue.  Dr. Amada Jupiter suggest that the patient stay on Keppra for 1 month while lamotrigine can be titrated up.  He further recommends the patient be treated with Klonopin 0.5 mg, x1 dose, tonight, before bedtime. [EW]    Clinical Course User Index [EW] Mancel Bale, MD   MDM Rules/Calculators/A&P                            Patient Vitals for the past 24 hrs:  BP Pulse SpO2  11/13/20 0700 120/75 67 100 %    11:59 AM Reevaluation with update and discussion. After initial assessment and treatment, an updated evaluation reveals he remains alert and comfortable.  He denies left  shoulder pain.  I discussed the findings and plan with him.  He is somewhat reluctant to continue taking Keppra but seem to understand the importance of doing that for least a month while his new prescription for lamotrigine can be titrated up.  He understands that he will need to work with his neurologist on this adjustment.  Findings discussed and questions answered. Mancel Bale   Medical Decision Making:  This patient is presenting for evaluation of seizures, which does require a range of treatment options, and is a complaint that  involves a high risk of morbidity and mortality. The differential diagnoses include medication noncompliance, epilepsy, electrolyte disorder. I decided to review old records, and in summary Young male with history of medication noncompliance, seizures and recurrent ED evaluations for same.  I did not require additional historical information from anyone.  Clinical Laboratory Tests Ordered, included CBC, Metabolic panel, and CBG . Review indicates CO2 low, glucose, anion gap low.  .  Critical Interventions-clinical evaluation, medication treatment, laboratory testing, observation and reassessment.  Discussion with neurology for recommendations on ongoing management.  Initiation of new medication for seizures.  After These Interventions, the Patient was reevaluated and was found with 2 seizures, followed by postictal state.  Patient noncompliant of Keppra because he does not like the fatigue associated with it.  He is advised to continue taking Keppra and started on lamotrigine, which will have to be gradually tapered up.  He is referred back to his neurologist for management of that.  CRITICAL CARE-yes Performed by: Mancel Bale  Nursing Notes Reviewed/ Care Coordinated Applicable Imaging Reviewed Interpretation of Laboratory Data incorporated into ED treatment  The patient appears reasonably screened and/or stabilized for discharge and I doubt any other medical  condition or other [facility] requiring further screening, evaluation, or treatment in the ED at this time prior to discharge.  Plan: Home Medications-continue Keppra; Home Treatments-avoid stimulation; return here if the recommended treatment, does not improve the symptoms; Recommended follow up-neurology follow-up as soon as possible     Final Clinical Impression(s) / ED Diagnoses Final diagnoses:  Seizure [facility])    Rx / DC Orders ED Discharge Orders     None        Mancel Bale, MD 11/14/20 1659

## 2020-11-13 NOTE — Discharge Instructions (Signed)
Start the prescriptions that were sent to your pharmacy, today.  It is important to continue taking levetiracetam, until the lamotrigine, medicine can be built up to a therapeutic dose.  This will take at least 30 days.  Your neurologist will have to help you with this titration dose.  Call your neurologist today for a follow-up appointment as soon as possible.

## 2020-11-20 ENCOUNTER — Telehealth: Payer: Self-pay | Admitting: Neurology

## 2020-11-20 NOTE — Telephone Encounter (Signed)
Received referral from ED. Called pt yesterday and left a message to offer 11/20/20 appt. Yesterday afternoon I called the patient's mother and she stated she was not sure if he would be able to come in 11/20/20 due to his work schedule, but she would have him call us back.

## 2020-12-13 ENCOUNTER — Other Ambulatory Visit: Payer: Self-pay

## 2020-12-13 ENCOUNTER — Ambulatory Visit: Payer: 59 | Admitting: Neurology

## 2020-12-13 ENCOUNTER — Encounter: Payer: Self-pay | Admitting: Neurology

## 2020-12-13 VITALS — BP 117/77 | HR 71 | Ht 71.0 in | Wt 158.0 lb

## 2020-12-13 DIAGNOSIS — G40009 Localization-related (focal) (partial) idiopathic epilepsy and epileptic syndromes with seizures of localized onset, not intractable, without status epilepticus: Secondary | ICD-10-CM

## 2020-12-13 MED ORDER — LEVETIRACETAM ER 500 MG PO TB24: ORAL_TABLET | ORAL | 3 refills | Status: DC

## 2020-12-13 NOTE — Patient Instructions (Signed)
Good to see you.  Continue Levetiracetam ER 500mg : Take 1 tablet every night  2. As per [place] driving laws, no driving until 6 months seizure-free  3. Follow-up in 6 months, call for any changes   Seizure Precautions: 1. If medication has been prescribed for you to prevent seizures, take it exactly as directed.  Do not stop taking the medicine without talking to your doctor first, even if you have not had a seizure in a long time.   2. Avoid activities in which a seizure would cause danger to yourself or to others.  Don't operate dangerous machinery, swim alone, or climb in high or dangerous places, such as on ladders, roofs, or girders.  Do not drive unless your doctor says you may.  3. If you have any warning that you may have a seizure, lay down in a safe place where you can't hurt yourself.    4.  No driving for 6 months from last seizure, as per [facility].   Please refer to the following link on the [employer]'s website for more information: http://www.epilepsyfoundation.org/answerplace/Social/driving/drivingu.cfm   5.  Maintain good sleep hygiene. Avoid alcohol.  6.  Contact your doctor if you have any problems that may be related to the medicine you are taking.  7.  Call 911 and bring the patient back to the ED if:        A.  The seizure lasts longer than 5 minutes.       B.  The patient doesn't awaken shortly after the seizure  C.  The patient has new problems such as difficulty seeing, speaking or moving  D.  The patient was injured during the seizure  E.  The patient has a temperature over 102 F (39C)  F.  The patient vomited and now is having trouble breathing

## 2020-12-13 NOTE — Progress Notes (Signed)
NEUROLOGY FOLLOW UP OFFICE NOTE  Jason Davenport [id number] Aug 21, 1990  HISTORY OF PRESENT ILLNESS: I had the pleasure of seeing Jason Davenport in follow-up in the neurology clinic on 12/13/2020.  The patient was last seen over a year ago for epilepsy. He is alone in the office today. Records and images were personally reviewed where available.  Since his last visit, he was in the ER for seizures on 10/22/20 and 11/13/20. He states he had been seizure-free for over a year until the seizures in 10/2020. He reported stopping the Keppra because it altered his personality and made him too sleepy. He reports stopping it a couple of months ago. He was started on Lamotrigine in the ER but did not take it, he states he has been taking Levetiracetam ER 500mg  qhs and has no issues with this. When he takes the daytime dose, it makes him lightheaded. He feels his personality is back to normal on qhs dosing. He lives with his cousin and denies being told of any staring/unresponsive episodes. He denies any gaps in time, olfactory/gustatory hallucinations, focal numbness/tingling/weakness, myoclonic jerks. No headaches, dizziness, vision changes, no other falls aside from due to seizures. He sustained a left periorbital hematoma with the last seizure and had a head CT in the ER with no intracranial abnormalities or fractures seen. Sleep is good. He works in a in [place]. He drinks alcohol occasionally, denies alcohol use prior to the recent seizures.   History on Initial Assessment 08/17/2019: This is a pleasant 30 year old right-handed man presenting to establish care for seizures. His first seizure in 01/2018 was a nocturnal seizure. He had another seizure in 08/2018, then 09/2018 and was started on Levetiracetam 500mg  BID at that time. He had a left shoulder dislocation and has had issues with this since then. He usually goes to the ER when he has a seizure and was in the ER on 02/08/19, then 06/17/19, and most  recently 07/13/19 when he had 2 seizures in one day. With the seizures in May, he had stopped his Keppra previously. He states he just forgot to take it. He has been taking Levetiracetam ER 500mg  BID since May and denies any further seizures. He recalls taking 1000mg  BID at one point but he was not feeling himself. With the seizures, he usually feels like he is getting dehydrated, sweating really hard and feeling lightheaded, then having a GTC. Witnesses have reported he stares off first before a convulsion. He has bitten his tongue and dislocated left shoulder again with the seizures. He states surgery is on hold until seizures are controlled. He denies any gaps in time, olfactory/gustatory hallucinations, deja vu, rising epigastric sensation, focal numbness/tingling/weakness, myoclonic jerks. No headaches, diplopia, dysarthria/dysphagia, neck/back pain, bowel/bladder dysfunction. Memory is good. He denies drinking alcohol and denies any sleep difficulties. Mood is good. He is currently unemployed.   Epilepsy Risk Factors: He had a normal birth and early development.  There is no history of febrile convulsions, CNS infections such as meningitis/encephalitis, significant traumatic brain injury, neurosurgical procedures, or family history of seizures.  Diagnostic Data: EEGs: normal wake EEG in 08/2018 MRI: I personally reviewed MRI brain with and without contrast done 08/2018 which did not show any acute changes. Hippocampi symmetric with no abnormal signal or enhancement seen.    PAST MEDICAL HISTORY: Past Medical History:  Diagnosis Date   Seizure [facility])     MEDICATIONS: Current Outpatient Medications on File Prior to Visit  Medication Sig Dispense Refill  levETIRAcetam (KEPPRA XR) 500 MG 24 hr tablet Take 1 tablet (500 mg total) by mouth daily. 60 tablet 0   No current facility-administered medications on file prior to visit.    ALLERGIES: No Known Allergies  FAMILY HISTORY: Family History   Problem Relation Age of Onset   Hypertension Mother    Hypertension Father     SOCIAL HISTORY: Social History   Socioeconomic History   Marital status: Single    Spouse name: Not on file   Number of children: 1   Years of education: some college   Highest education level: Not on file  Occupational History   Occupation: Not working right now  Tobacco Use   Smoking status: Every Day    Packs/day: 0.25    Types: Cigarettes   Smokeless tobacco: Never  Vaping Use   Vaping Use: Never used  Substance and Sexual Activity   Alcohol use: Yes   Drug use: Yes    Types: Marijuana   Sexual activity: Not on file  Other Topics Concern   Not on file  Social History Narrative   Right-handed.   3-4 cups caffeine per day.   Lives at home alone.   Social Determinants of Health   Financial Resource Strain: Not on file  Food Insecurity: Not on file  Transportation Needs: Not on file  Physical Activity: Not on file  Stress: Not on file  Social Connections: Not on file  Intimate Partner Violence: Not on file     PHYSICAL EXAM: Vitals:   12/13/20 0831  BP: 117/77  Pulse: 71  SpO2: 99%   General: No acute distress Head:  Normocephalic/atraumatic Skin/Extremities: No rash, no edema Neurological Exam: alert and awake. No aphasia or dysarthria. Fund of knowledge is appropriate.  Recent and remote memory are intact.  Attention and concentration are normal.   Cranial nerves: Pupils equal, round. Extraocular movements intact with no nystagmus. Visual fields full.  No facial asymmetry.  Motor: Bulk and tone normal, muscle strength 5/5 throughout with no pronator drift.   Finger to nose testing intact.  Gait narrow-based and steady, able to tandem walk adequately.  Romberg negative.   IMPRESSION: This is a pleasant 30 yo RH man with a history of seizures since 2019. 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 MRI brain and routine EEG normal. He  went over a year seizure-free until he had seizures in 10/2020, most recently 11/13/20 after stopping seizure medication. He denies any side effects when taking medication at bedtime, continue Levetiracetam ER 500mg  qhs, we may uptitrate as tolerated. We discussed avoidance of seizure triggers, including missing medication, alcohol, sleep deprivation. He is aware of [place] driving laws to stop driving until 6 month seizure-free. Follow-up in 6 months, he knows to call for any changes.    Thank you for allowing me to participate in his care.  Please do not hesitate to call for any questions or concerns.    , M.D.

## 2021-01-02 ENCOUNTER — Emergency Department (HOSPITAL_COMMUNITY): Payer: 59

## 2021-01-02 ENCOUNTER — Encounter (HOSPITAL_COMMUNITY): Payer: Self-pay

## 2021-01-02 ENCOUNTER — Emergency Department (HOSPITAL_COMMUNITY)
Admission: EM | Admit: 2021-01-02 | Discharge: 2021-01-02 | Disposition: A | Discharge status: Home / Self Care | Payer: 59 | Attending: Emergency Medicine | Admitting: Emergency Medicine

## 2021-01-02 ENCOUNTER — Other Ambulatory Visit: Payer: Self-pay

## 2021-01-02 DIAGNOSIS — R569 Unspecified convulsions: Secondary | ICD-10-CM | POA: Diagnosis not present

## 2021-01-02 DIAGNOSIS — R0682 Tachypnea, not elsewhere classified: Secondary | ICD-10-CM | POA: Insufficient documentation

## 2021-01-02 DIAGNOSIS — S43004A Unspecified dislocation of right shoulder joint, initial encounter: Secondary | ICD-10-CM | POA: Diagnosis not present

## 2021-01-02 DIAGNOSIS — X58XXXA Exposure to other specified factors, initial encounter: Secondary | ICD-10-CM | POA: Diagnosis not present

## 2021-01-02 DIAGNOSIS — F17210 Nicotine dependence, cigarettes, uncomplicated: Secondary | ICD-10-CM | POA: Insufficient documentation

## 2021-01-02 DIAGNOSIS — Z79899 Other long term (current) drug therapy: Secondary | ICD-10-CM | POA: Insufficient documentation

## 2021-01-02 DIAGNOSIS — R000 Tachycardia, unspecified: Secondary | ICD-10-CM | POA: Diagnosis not present

## 2021-01-02 DIAGNOSIS — S4991XA Unspecified injury of right shoulder and upper arm, initial encounter: Secondary | ICD-10-CM | POA: Diagnosis present

## 2021-01-02 LAB — CBC WITH DIFFERENTIAL/PLATELET
Abs Immature Granulocytes: 0.1 10*3/uL — ABNORMAL HIGH (ref 0.00–0.07)
Basophils Absolute: 0 10*3/uL (ref 0.0–0.1)
Basophils Relative: 0 %
Eosinophils Absolute: 0 10*3/uL (ref 0.0–0.5)
Eosinophils Relative: 0 %
HCT: 44.6 % (ref 39.0–52.0)
Hemoglobin: 15 g/dL (ref 13.0–17.0)
Immature Granulocytes: 1 %
Lymphocytes Relative: 4 %
Lymphs Abs: 0.7 10*3/uL (ref 0.7–4.0)
MCH: 30.2 pg (ref 26.0–34.0)
MCHC: 33.6 g/dL (ref 30.0–36.0)
MCV: 89.7 fL (ref 80.0–100.0)
Monocytes Absolute: 0.9 10*3/uL (ref 0.1–1.0)
Monocytes Relative: 5 %
Neutro Abs: 17.3 10*3/uL — ABNORMAL HIGH (ref 1.7–7.7)
Neutrophils Relative %: 90 %
Platelets: 344 10*3/uL (ref 150–400)
RBC: 4.97 MIL/uL (ref 4.22–5.81)
RDW: 12.4 % (ref 11.5–15.5)
WBC: 19 10*3/uL — ABNORMAL HIGH (ref 4.0–10.5)
nRBC: 0 % (ref 0.0–0.2)

## 2021-01-02 LAB — BASIC METABOLIC PANEL
Anion gap: 11 (ref 5–15)
BUN: 10 mg/dL (ref 6–20)
CO2: 18 mmol/L — ABNORMAL LOW (ref 22–32)
Calcium: 8.5 mg/dL — ABNORMAL LOW (ref 8.9–10.3)
Chloride: 106 mmol/L (ref 98–111)
Creatinine, Ser: 1.19 mg/dL (ref 0.61–1.24)
GFR, Estimated: >60 mL/min (ref >60)
Glucose, Bld: 143 mg/dL — ABNORMAL HIGH (ref 70–99)
Potassium: 3.6 mmol/L (ref 3.5–5.1)
Sodium: 135 mmol/L (ref 135–145)

## 2021-01-02 LAB — CBG MONITORING, ED: Glucose-Capillary: 157 mg/dL — ABNORMAL HIGH (ref 70–99)

## 2021-01-02 MED ORDER — SODIUM CHLORIDE 0.9 % IV SOLN
4000.0000 mg | Freq: Once | INTRAVENOUS | Status: DC
  Filled 2021-01-02: qty 40

## 2021-01-02 MED ORDER — LEVETIRACETAM IN NACL 1500 MG/100ML IV SOLN
1500.0000 mg | Freq: Once | INTRAVENOUS | Status: AC
  Administered 2021-01-02: 16:00:00 1500 mg via INTRAVENOUS
  Filled 2021-01-02: qty 100

## 2021-01-02 MED ORDER — LORAZEPAM 2 MG/ML IJ SOLN
INTRAMUSCULAR | Status: AC
  Administered 2021-01-02: 16:00:00 2 mg via INTRAVENOUS
  Filled 2021-01-02: qty 1

## 2021-01-02 MED ORDER — LEVETIRACETAM ER 750 MG PO TB24: ORAL_TABLET | ORAL | 0 refills | Status: DC

## 2021-01-02 MED ORDER — LORAZEPAM 2 MG/ML IJ SOLN: 2.0000 mg | Freq: Once | INTRAMUSCULAR | Status: AC

## 2021-01-02 NOTE — ED Provider Notes (Signed)
  Physical Exam  BP 120/89   Pulse 93   Temp 98.2 F (36.8 C) (Oral)   Resp 18   Ht 5\' 11"  (1.803 m)   Wt 72 kg   SpO2 99%   BMI 22.14 kg/m   Physical Exam Mildly drowsy, postictal state but easily arousable Obvious deformity to right shoulder, distal pulses and sensation intact   ED Course/Procedures     .Ortho Injury Treatment  Date/Time: 01/02/2021 5:30 PM Performed by: 01/04/2021, MD Authorized by: Milagros Loll, MD   Consent:    Consent obtained:  Verbal   Consent given by:  Patient   Risks discussed:  Fracture, irreducible dislocation, recurrent dislocation, nerve damage, restricted joint movement and stiffness   Alternatives discussed:  No treatmentInjury location: shoulder Injury type: dislocation Dislocation type: anterior Hill-Sachs deformity: no Chronicity: recurrent Pre-procedure neurovascular assessment: neurovascularly intact Pre-procedure distal perfusion: normal Pre-procedure neurological function: normal Pre-procedure range of motion: reduced  Anesthesia: Local anesthesia used: no  Patient sedated: NoManipulation performed: yes Reduction method: cunningham. Reduction successful: yes X-ray confirmed reduction: yes Immobilization: sling Post-procedure neurovascular assessment: post-procedure neurovascularly intact Post-procedure distal perfusion: normal Post-procedure neurological function: normal Post-procedure range of motion: normal    MDM  30 year old with prior history of seizures and prior history of shoulder dislocation presenting to ER after dislocated shoulder after seizure.  Patient had additional seizure in ER and given Keppra load and Ativan.  Patient currently postictal but arousable.  X-ray showed shoulder dislocation, performed closed reduction at bedside successfully.  Patient tolerated well.  Patient was observed in ER.  No further seizure episodes.  Vital signs remained stable.  Patient feels that he is back to his  normal self.  He is alert and answering questions appropriately.  I discussed the case with Dr. 37 who recommends increasing his Keppra Exar dosing to 750 mg nightly.  Strongly encourage patient to follow-up with both orthopedics and with neurology.  Discharged home.       Otelia Limes, MD 01/03/21 0005

## 2021-01-02 NOTE — ED Notes (Signed)
Seizure pads placed on bed rails 

## 2021-01-02 NOTE — ED Notes (Signed)
Ortho tech called to apply sling.

## 2021-01-02 NOTE — Progress Notes (Signed)
Orthopedic Tech Progress Note Patient Details:  Jason Davenport 02/08/1991 [id number]  Patient ID: [name], male   DOB: 1991-01-13, 30 y.o.   MRN: [id number] Dropped sling off, and waiting for call back after nurse confirms with MD that the sling is safe for pt due to seizures.  [name] 01/02/2021, 6:00 PM

## 2021-01-02 NOTE — ED Notes (Signed)
Pt started having seizure like activities. MD called to bedside. Pt placed on NRB.

## 2021-01-02 NOTE — Discharge Instructions (Addendum)
Our neurologist recommended increasing his Keppra dosing to 750 mg Keppra XR nightly.  Please follow-up with your neurologist as well as orthopedic surgery regarding your shoulder dislocation.  Until you are cleared by orthopedics, continue using the shoulder sling.  Do not lift your arm above your shoulder as this may cause recurrent dislocation.  If you have further seizure activity, come back to ER for reassessment.

## 2021-01-02 NOTE — ED Provider Notes (Signed)
[facility]-EMERGENCY DEPT Provider Note   CSN: [id number] Arrival date & time: 01/02/21  1529     History Chief Complaint  Patient presents with   Shoulder Injury   Seizures    Jason Davenport is a 30 y.o. male.   Shoulder Injury  Seizures   30 year old male presenting to the ED from EMS with a dislocated right shoulder after a seizure. The patient has known seizures and is on Keppra. Reportedly no meds administered by EMS as his seizures aborted spontaneously PTA. I entered the room as the patient was reported to be seizing actively after being bedded from triage. He was diaphoretic and hypoxic to the 70s. He was placed on a non-rebreather and 2mg  IV Ativan was administered. O2 saturations improved immediately to 99%. Tonic clonic seizure activity subsequently aborted after 1 minute. Given multiple seizures today, he was loaded with 4g of IV Keppra. XR imaging of the right shoulder ordered. CBG on arrival 157. Level 5 caveat due to patient acuity.  Past Medical History:  Diagnosis Date   Seizure [facility])     Patient Active Problem List   Diagnosis Date Noted   Seizures (HCC) 09/29/2018   Anterior dislocation of left shoulder 09/29/2018   Seizure (HCC) 08/17/2018   Anterior shoulder dislocation, left, initial encounter 08/17/2018    Past Surgical History:  Procedure Laterality Date   no past surgeries         Family History  Problem Relation Age of Onset   Hypertension Mother    Hypertension Father     Social History   Tobacco Use   Smoking status: Every Day    Packs/day: 0.25    Types: Cigarettes   Smokeless tobacco: Never  Vaping Use   Vaping Use: Never used  Substance Use Topics   Alcohol use: Yes   Drug use: Yes    Types: Marijuana    Home Medications Prior to Admission medications   Medication Sig Start Date End Date Taking? Authorizing Provider  levETIRAcetam (KEPPRA XR) 500 MG 24 hr tablet Take 1 tablet every night 12/13/20    12/15/20, MD    Allergies    Patient has no known allergies.  Review of Systems   Review of Systems  Unable to perform ROS: Acuity of condition  Neurological:  Positive for seizures.   Physical Exam Updated Vital Signs BP 115/77   Pulse 90   Temp 98.2 F (36.8 C) (Oral)   Resp 18   Ht 5\' 11"  (1.803 m)   Wt 72 kg   SpO2 96%   BMI 22.14 kg/m   Physical Exam Vitals and nursing note reviewed.  Constitutional:      General: He is in acute distress.     Appearance: He is diaphoretic.     Comments: Actively seizing, aborted following IV Ativan  HENT:     Head: Normocephalic and atraumatic.  Eyes:     Conjunctiva/sclera: Conjunctivae normal.  Cardiovascular:     Rate and Rhythm: Regular rhythm. Tachycardia present.  Pulmonary:     Effort: Tachypnea present.  Abdominal:     General: There is no distension.     Tenderness: There is no guarding.  Musculoskeletal:        General: No deformity or signs of injury.     Cervical back: Neck supple.  Skin:    Findings: No lesion or rash.  Neurological:     Comments: Immediately s/p seizure, Post-ictal and somnolent. GCS 10 (  3-1-6). Moving all four extremities spontaneously s/p seizure.    ED Results / Procedures / Treatments   Labs (all labs ordered are listed, but only abnormal results are displayed) Labs Reviewed  CBC WITH DIFFERENTIAL/PLATELET - Abnormal; Notable for the following components:      Result Value   WBC 19.0 (*)    Neutro Abs 17.3 (*)    Abs Immature Granulocytes 0.10 (*)    All other components within normal limits  BASIC METABOLIC PANEL - Abnormal; Notable for the following components:   CO2 18 (*)    Glucose, Bld 143 (*)    Calcium 8.5 (*)    All other components within normal limits  CBG MONITORING, ED - Abnormal; Notable for the following components:   Glucose-Capillary 157 (*)    All other components within normal limits  LEVETIRACETAM LEVEL    EKG None  Radiology No results  found.  Procedures Procedures   Medications Ordered in ED Medications  LORazepam (ATIVAN) injection 2 mg (2 mg Intravenous Given 01/02/21 1604)  levETIRAcetam (KEPPRA) IVPB 1500 mg/ 100 mL premix (1,500 mg Intravenous New Bag/Given 01/02/21 1627)    ED Course  I have reviewed the triage vital signs and the nursing notes.  Pertinent labs & imaging results that were available during my care of the patient were reviewed by me and considered in my medical decision making (see chart for details).    MDM Rules/Calculators/A&P                           30 year old male presenting to the ED from EMS with a dislocated right shoulder after a seizure. The patient has known seizures and is on Keppra. Reportedly no meds administered by EMS as his seizures aborted spontaneously PTA. I entered the room as the patient was reported to be seizing actively after being bedded from triage. He was diaphoretic and hypoxic to the 70s. He was placed on a non-rebreather and 2mg  IV Ativan was administered. O2 saturations improved immediately to 99%. Tonic clonic seizure activity subsequently aborted after 1 minute. Given multiple seizures today, he was loaded with 4g of IV Keppra. XR imaging of the right shoulder ordered. CBG on arrival 157.  Signout given to Dr. at [phone].   Final Clinical Impression(s) / ED Diagnoses Final diagnoses:  Seizure (HCC)  Dislocation of right shoulder joint, initial encounter    Rx / DC Orders ED Discharge Orders     None        [id number], MD 01/02/21 1644

## 2021-01-02 NOTE — ED Triage Notes (Signed)
Pt BIB EMS. Pt coming from home c/o dislocated right shoulder. Per EMS during route pt started to have seizure. EMS stated they did not give him any medication due to pt stopped having seizure right after medications were drawn up. Hx of seizures. VSS.

## 2021-01-03 ENCOUNTER — Telehealth: Payer: Self-pay

## 2021-01-03 ENCOUNTER — Other Ambulatory Visit: Payer: Self-pay | Admitting: Neurology

## 2021-01-03 LAB — LEVETIRACETAM LEVEL: Levetiracetam Lvl: <2 ug/mL — ABNORMAL LOW (ref 10.0–40.0)

## 2021-01-03 MED ORDER — LEVETIRACETAM ER 750 MG PO TB24: ORAL_TABLET | ORAL | 3 refills | Status: DC

## 2021-01-03 NOTE — Telephone Encounter (Signed)
Pt called no answer left a voice mail to call the office back  °

## 2021-01-06 NOTE — Telephone Encounter (Signed)
Spoke with pt he did not notice any triggers when he had his seizure the other night he stated he thinks he was sleeping when it happened, he will go by the pharmacy today to pick up his script for the higher dose of keppra when he gets off work

## 2021-02-01 ENCOUNTER — Emergency Department (EMERGENCY_DEPARTMENT_HOSPITAL)
Admission: EM | Admit: 2021-02-01 | Discharge: 2021-02-01 | Discharge status: Against Medical Advice | Payer: 59 | Source: Home / Self Care | Attending: Emergency Medicine | Admitting: Emergency Medicine

## 2021-02-01 ENCOUNTER — Encounter (HOSPITAL_BASED_OUTPATIENT_CLINIC_OR_DEPARTMENT_OTHER): Payer: Self-pay | Admitting: Emergency Medicine

## 2021-02-01 ENCOUNTER — Emergency Department (HOSPITAL_BASED_OUTPATIENT_CLINIC_OR_DEPARTMENT_OTHER): Payer: 59

## 2021-02-01 ENCOUNTER — Encounter (HOSPITAL_COMMUNITY): Payer: Self-pay

## 2021-02-01 ENCOUNTER — Emergency Department (HOSPITAL_BASED_OUTPATIENT_CLINIC_OR_DEPARTMENT_OTHER)
Admission: EM | Admit: 2021-02-01 | Discharge: 2021-02-01 | Disposition: A | Discharge status: Home / Self Care | Payer: 59 | Attending: Emergency Medicine | Admitting: Emergency Medicine

## 2021-02-01 ENCOUNTER — Other Ambulatory Visit: Payer: Self-pay

## 2021-02-01 DIAGNOSIS — F17210 Nicotine dependence, cigarettes, uncomplicated: Secondary | ICD-10-CM | POA: Insufficient documentation

## 2021-02-01 DIAGNOSIS — X58XXXA Exposure to other specified factors, initial encounter: Secondary | ICD-10-CM | POA: Insufficient documentation

## 2021-02-01 DIAGNOSIS — S0993XA Unspecified injury of face, initial encounter: Secondary | ICD-10-CM | POA: Diagnosis not present

## 2021-02-01 DIAGNOSIS — Z20822 Contact with and (suspected) exposure to covid-19: Secondary | ICD-10-CM | POA: Insufficient documentation

## 2021-02-01 DIAGNOSIS — R569 Unspecified convulsions: Secondary | ICD-10-CM

## 2021-02-01 DIAGNOSIS — Z79899 Other long term (current) drug therapy: Secondary | ICD-10-CM | POA: Diagnosis not present

## 2021-02-01 DIAGNOSIS — S43004A Unspecified dislocation of right shoulder joint, initial encounter: Secondary | ICD-10-CM | POA: Diagnosis not present

## 2021-02-01 DIAGNOSIS — G40909 Epilepsy, unspecified, not intractable, without status epilepticus: Secondary | ICD-10-CM | POA: Diagnosis not present

## 2021-02-01 DIAGNOSIS — S4991XA Unspecified injury of right shoulder and upper arm, initial encounter: Secondary | ICD-10-CM | POA: Diagnosis present

## 2021-02-01 DIAGNOSIS — M25511 Pain in right shoulder: Secondary | ICD-10-CM | POA: Insufficient documentation

## 2021-02-01 LAB — CBC WITH DIFFERENTIAL/PLATELET
Abs Immature Granulocytes: 0.03 10*3/uL (ref 0.00–0.07)
Basophils Absolute: 0 10*3/uL (ref 0.0–0.1)
Basophils Relative: 0 %
Eosinophils Absolute: 0 10*3/uL (ref 0.0–0.5)
Eosinophils Relative: 0 %
HCT: 41.6 % (ref 39.0–52.0)
Hemoglobin: 13.9 g/dL (ref 13.0–17.0)
Immature Granulocytes: 0 %
Lymphocytes Relative: 5 %
Lymphs Abs: 0.6 10*3/uL — ABNORMAL LOW (ref 0.7–4.0)
MCH: 30 pg (ref 26.0–34.0)
MCHC: 33.4 g/dL (ref 30.0–36.0)
MCV: 89.7 fL (ref 80.0–100.0)
Monocytes Absolute: 0.4 10*3/uL (ref 0.1–1.0)
Monocytes Relative: 3 %
Neutro Abs: 11.2 10*3/uL — ABNORMAL HIGH (ref 1.7–7.7)
Neutrophils Relative %: 92 %
Platelets: 347 10*3/uL (ref 150–400)
RBC: 4.64 MIL/uL (ref 4.22–5.81)
RDW: 12.2 % (ref 11.5–15.5)
WBC: 12.3 10*3/uL — ABNORMAL HIGH (ref 4.0–10.5)
nRBC: 0 % (ref 0.0–0.2)

## 2021-02-01 LAB — COMPREHENSIVE METABOLIC PANEL
ALT: 19 U/L (ref 0–44)
AST: 24 U/L (ref 15–41)
Albumin: 4.1 g/dL (ref 3.5–5.0)
Alkaline Phosphatase: 97 U/L (ref 38–126)
Anion gap: 8 (ref 5–15)
BUN: 7 mg/dL (ref 6–20)
CO2: 23 mmol/L (ref 22–32)
Calcium: 8.9 mg/dL (ref 8.9–10.3)
Chloride: 103 mmol/L (ref 98–111)
Creatinine, Ser: 1.08 mg/dL (ref 0.61–1.24)
GFR, Estimated: >60 mL/min (ref >60)
Glucose, Bld: 101 mg/dL — ABNORMAL HIGH (ref 70–99)
Potassium: 4 mmol/L (ref 3.5–5.1)
Sodium: 134 mmol/L — ABNORMAL LOW (ref 135–145)
Total Bilirubin: 0.7 mg/dL (ref 0.3–1.2)
Total Protein: 7.8 g/dL (ref 6.5–8.1)

## 2021-02-01 LAB — RESP PANEL BY RT-PCR (FLU A&B, COVID) ARPGX2
Influenza A by PCR: NEGATIVE
Influenza B by PCR: NEGATIVE
SARS Coronavirus 2 by RT PCR: NEGATIVE

## 2021-02-01 LAB — MAGNESIUM: Magnesium: 2.2 mg/dL (ref 1.7–2.4)

## 2021-02-01 MED ORDER — PROPOFOL 10 MG/ML IV BOLUS
70.0000 mg | Freq: Once | INTRAVENOUS | Status: AC
  Administered 2021-02-01: 70 mg via INTRAVENOUS
  Filled 2021-02-01: qty 20

## 2021-02-01 MED ORDER — ACETAMINOPHEN 500 MG PO TABS
1000.0000 mg | ORAL_TABLET | Freq: Once | ORAL | Status: AC
  Administered 2021-02-01: 1000 mg via ORAL
  Filled 2021-02-01: qty 2

## 2021-02-01 MED ORDER — PROPOFOL 10 MG/ML IV BOLUS
INTRAVENOUS | Status: AC | PRN
  Administered 2021-02-01: 40 mg via INTRAVENOUS

## 2021-02-01 MED ORDER — LORAZEPAM 2 MG/ML IJ SOLN
1.0000 mg | Freq: Once | INTRAMUSCULAR | Status: DC
  Filled 2021-02-01: qty 1

## 2021-02-01 MED ORDER — LEVETIRACETAM IN NACL 500 MG/100ML IV SOLN
500.0000 mg | Freq: Once | INTRAVENOUS | Status: AC
  Administered 2021-02-01: 500 mg via INTRAVENOUS
  Filled 2021-02-01: qty 100

## 2021-02-01 MED ORDER — HYDROMORPHONE HCL 1 MG/ML IJ SOLN
1.0000 mg | Freq: Once | INTRAMUSCULAR | Status: AC
  Administered 2021-02-01: 1 mg via INTRAVENOUS
  Filled 2021-02-01: qty 1

## 2021-02-01 MED ORDER — HYDROMORPHONE HCL 1 MG/ML IJ SOLN: 1.0000 mg | Freq: Once | INTRAMUSCULAR | Status: DC

## 2021-02-01 MED ORDER — LEVETIRACETAM IN NACL 1000 MG/100ML IV SOLN
1000.0000 mg | Freq: Once | INTRAVENOUS | Status: AC
  Administered 2021-02-01: 1000 mg via INTRAVENOUS
  Filled 2021-02-01: qty 100

## 2021-02-01 NOTE — Progress Notes (Signed)
Brief Phone consult:  [name] is a 30 y.o. male with hx of seizures who follows with Dr. Karel Jarvis and is intermittently non compliant with his Keppra. He presents with shoulder dislocation initially to [facility] ED. In the afternoon, was noted by roommate to have a seizure. He had another seizure with lipsmacking with EMS and had another seizure in the ED. Might have had 4 seizures today. He is back to his baseline mentation.  Recs: - Load with Keppra 1500mg (will bring him to over 20mg /kg) - Counseling on the importance of taking Keppra. - Continue Keppra 750mg  XR with his dose tonight. - Observe in the ED overnight to ensure no seizure clustering - No driving for 6 months. Has to be seizure free before he can resume driving. Follow up with Dr. in clinic in 2-3 weeks.  Discussed with Dr. .  [facility] Pager Number Karel Jarvis

## 2021-02-01 NOTE — Discharge Instructions (Addendum)
You are leaving AGAINST MEDICAL ADVICE.  It was recommended that you stay in the hospital for observation due to multiple seizures tonight.  If at any point you change your mind, call 911 or return to the ER.  You need to start taking your seizure medicine, Keppra, tonight.  Take it daily as prescribed.  Call your neurologist tomorrow.  Do not drive until cleared by your doctor.

## 2021-02-01 NOTE — ED Provider Notes (Signed)
[facility] EMERGENCY DEPT Provider Note   CSN: [id number] Arrival date & time: 02/01/21  1058     History Chief Complaint  Patient presents with   Shoulder Injury    Jason Davenport is a 30 y.o. male.  Presented to the emergency room with concern for shoulder dislocation.  Patient states that he woke up with pain in his right shoulder this morning, thinks that he may have slept on it abnormally.  He is not aware of any seizures.  Denies any bladder or bowel incontinence.  No tongue biting or lip injury.  Has had recurrent shoulder dislocations, also has had recent visits for seizures.  States that he has not followed up with orthopedics.  Currently pain is up to 10 and 10 in severity in his right shoulder, worse with movement, improved with rest, no associated numbness or tingling.  HPI     Past Medical History:  Diagnosis Date   Seizure [facility])     Patient Active Problem List   Diagnosis Date Noted   Seizures (HCC) 09/29/2018   Anterior dislocation of left shoulder 09/29/2018   Seizure (HCC) 08/17/2018   Anterior shoulder dislocation, left, initial encounter 08/17/2018    Past Surgical History:  Procedure Laterality Date   no past surgeries         Family History  Problem Relation Age of Onset   Hypertension Mother    Hypertension Father     Social History   Tobacco Use   Smoking status: Every Day    Packs/day: 0.25    Types: Cigarettes   Smokeless tobacco: Never  Vaping Use   Vaping Use: Never used  Substance Use Topics   Alcohol use: Yes   Drug use: Yes    Types: Marijuana    Home Medications Prior to Admission medications   Medication Sig Start Date End Date Taking? Authorizing Provider  Levetiracetam (KEPPRA XR) 750 MG TB24 Take 1 tablet every night 01/03/21   Van Clines, MD    Allergies    Patient has no known allergies.  Review of Systems   Review of Systems  Constitutional:  Negative for chills and fever.  HENT:   Negative for ear pain and sore throat.   Eyes:  Negative for pain and visual disturbance.  Respiratory:  Negative for cough and shortness of breath.   Cardiovascular:  Negative for chest pain and palpitations.  Gastrointestinal:  Negative for abdominal pain and vomiting.  Genitourinary:  Negative for dysuria and hematuria.  Musculoskeletal:  Positive for arthralgias. Negative for back pain.  Skin:  Negative for color change and rash.  Neurological:  Negative for seizures and syncope.  All other systems reviewed and are negative.  Physical Exam Updated Vital Signs BP 128/82 (BP Location: Left Arm)    Pulse (!) 58    Temp 98.1 F (36.7 C) (Oral)    Resp (!) 23    Ht 6' (1.829 m)    Wt 72.6 kg    SpO2 100%    BMI 21.70 kg/m   Physical Exam Vitals and nursing note reviewed.  Constitutional:      General: He is not in acute distress.    Appearance: He is well-developed.  HENT:     Head: Normocephalic and atraumatic.  Eyes:     Conjunctiva/sclera: Conjunctivae normal.  Cardiovascular:     Rate and Rhythm: Normal rate.     Pulses: Normal pulses.  Pulmonary:     Effort: Pulmonary effort is normal. No  respiratory distress.  Abdominal:     Palpations: Abdomen is soft.     Tenderness: There is no abdominal tenderness.  Musculoskeletal:     Cervical back: Neck supple.     Comments: Obvious deformity to right shoulder, limited range of motion, radial pulse intact, sensation intact, motor intact  Skin:    General: Skin is warm and dry.     Capillary Refill: Capillary refill takes less than 2 seconds.  Neurological:     Mental Status: He is alert.  Psychiatric:        Mood and Affect: Mood normal.    ED Results / Procedures / Treatments   Labs (all labs ordered are listed, but only abnormal results are displayed) Labs Reviewed - No data to display  EKG None  Radiology DG Shoulder Right  Result Date: 02/01/2021 CLINICAL DATA:  Awoke this morning with right shoulder pain.  EXAM: RIGHT SHOULDER - 2+ VIEW COMPARISON:  01/02/2021. FINDINGS: Anterior dislocation, humeral head abutting the anterior margin of the glenoid. There is a small elliptical bone fragment that projects over the scapula, below the coracoid process, on the AP view, not visualized on the Y scapular view. This suggests a small fracture, likely from impaction of the posterosuperior humeral head, and is new compared to the prior exam. IMPRESSION: 1. Anterior right shoulder/humeral head dislocation. 2. Small fracture fragment that lies below the coracoid process of the scapula and medial to the humeral head. Exact origin of this fracture is not defined, but is suspected to be from impaction of the humeral head. Electronically Signed   By: Lajean Manes M.D.   On: 02/01/2021 12:11   DG Shoulder Right Portable  Result Date: 02/01/2021 CLINICAL DATA:  30 year old male with history of shoulder dislocation EXAM: PORTABLE RIGHT SHOULDER COMPARISON:  Earlier the same day FINDINGS: Anatomic alignment of the right glenohumeral joint post reduction. There is similar well-defined ellipsoid osseous fragment projecting just inferior to the scapular spine on the frontal view. No obvious glenohumeral or humeral head fracture deformity noted. IMPRESSION: 1. Anatomic alignment status post shoulder reduction. 2. Small osseous fracture fragment is again seen projecting just inferior to the scapular spine on frontal projection. No obvious Hill-Sachs or bony Bankart abnormality on this limited evaluation. Electronically Signed   By: Ruthann Cancer M.D.   On: 02/01/2021 13:05    Procedures .Ortho Injury Treatment  Date/Time: 02/02/2021 10:11 AM Performed by: Lucrezia Starch, MD Authorized by: Lucrezia Starch, MD   Consent:    Consent obtained:  Verbal   Consent given by:  Patient   Risks discussed:  Fracture, nerve damage, restricted joint movement, stiffness, recurrent dislocation and irreducible dislocation    Alternatives discussed:  No treatmentInjury location: shoulder Location details: right shoulder Injury type: dislocation Dislocation type: anterior Hill-Sachs deformity: no Chronicity: recurrent Pre-procedure neurovascular assessment: neurovascularly intact Pre-procedure distal perfusion: normal Pre-procedure neurological function: normal Pre-procedure range of motion: reduced  Anesthesia: Local anesthesia used: no  Patient sedated: Yes. Refer to sedation procedure documentation for details of sedation. Manipulation performed: yes Reduction method: traction and counter traction Reduction successful: yes X-ray confirmed reduction: yes Immobilization: sling (Applied by MD) Post-procedure neurovascular assessment: post-procedure neurovascularly intact Post-procedure distal perfusion: normal Post-procedure neurological function: normal Post-procedure range of motion: normal   .Sedation  Date/Time: 02/02/2021 10:12 AM Performed by: Lucrezia Starch, MD Authorized by: Lucrezia Starch, MD   Consent:    Consent obtained:  Written   Consent given by:  Patient  Risks discussed:  Allergic reaction, dysrhythmia, prolonged sedation necessitating reversal, prolonged hypoxia resulting in organ damage, respiratory compromise necessitating ventilatory assistance and intubation, inadequate sedation, nausea and vomiting   Alternatives discussed:  Analgesia without sedation, anxiolysis and regional anesthesia Universal protocol:    Immediately prior to procedure, a time out was called: yes     Patient identity confirmed:  Arm band Indications:    Procedure performed:  Dislocation reduction   Procedure necessitating sedation performed by:  Physician performing sedation Pre-sedation assessment:    Time since last food or drink:  > 8 hours   ASA classification: class 1 - normal, healthy patient     Mouth opening:  3 or more finger widths   Thyromental distance:  4 finger widths    Mallampati score:  I - soft palate, uvula, fauces, pillars visible   Pre-sedation assessments completed and reviewed: airway patency, cardiovascular function, hydration status, mental status, nausea/vomiting and pain level   Immediate pre-procedure details:    Reviewed: vital signs     Verified: bag valve mask available, emergency equipment available, intubation equipment available, IV patency confirmed, oxygen available, reversal medications available and suction available   Procedure details (see MAR for exact dosages):    Preoxygenation:  Room air   Sedation:  Propofol   Intended level of sedation: deep   Analgesia:  None   Intra-procedure monitoring:  Cardiac monitor, blood pressure monitoring, continuous capnometry, continuous pulse oximetry, frequent LOC assessments and frequent vital sign checks   Intra-procedure events: none     Total Provider sedation time (minutes):  12 Post-procedure details:    Attendance: Constant attendance by certified staff until patient recovered     Post-sedation assessments completed and reviewed: airway patency, cardiovascular function, hydration status, mental status, nausea/vomiting, pain level, respiratory function and temperature     Patient is stable for discharge or admission: yes     Procedure completion:  Tolerated well, no immediate complications   Medications Ordered in ED Medications  HYDROmorphone (DILAUDID) injection 1 mg (1 mg Intravenous Given 02/01/21 1136)  propofol (DIPRIVAN) 10 mg/mL bolus/IV push 70 mg (70 mg Intravenous Given 02/01/21 1226)  propofol (DIPRIVAN) 10 mg/mL bolus/IV push (40 mg Intravenous Given 02/01/21 1231)    ED Course  I have reviewed the triage vital signs and the nursing notes.  Pertinent labs & imaging results that were available during my care of the patient were reviewed by me and considered in my medical decision making (see chart for details).    MDM Rules/Calculators/A&P                          30 year old woman presented to ER with concern for right shoulder dislocation.  Neurovascularly intact.  Has history of prior shoulder dislocations.  Some of his prior shoulder dislocations were associated with seizure activity.  Patient denies any seizure activity today.  Attempted Cunningham technique without success at bedside.  Proceeded with procedural sedation and successfully reduced shoulder utilizing traction/countertraction.  Patient tolerated sedation and reduction well. Placed in sling.  Post procedure remains neurovascular intact.  X-ray confirms successful reduction.  Stressed importance of patient follow-up with orthopedics to discuss further management of his recurrent shoulder dislocations.    After the discussed management above, the patient was determined to be safe for discharge.  The patient was in agreement with this plan and all questions regarding their care were answered.  ED return precautions were discussed and the patient will return  to the ED with any significant worsening of condition.     Final Clinical Impression(s) / ED Diagnoses Final diagnoses:  None    Rx / DC Orders ED Discharge Orders     None        Lucrezia Starch, MD 02/02/21 1017

## 2021-02-01 NOTE — ED Notes (Signed)
MD Criss Alvine to room to assess pt. Pt no longer having seizure like activity at this time.

## 2021-02-01 NOTE — ED Notes (Signed)
Pt having seizure. Pt lip smacking, gazing off into distance, pt not responding to ed rn, pt's limbs limp and not able to grasp, pt diaphoretic at this time.  No vital sign changes noted, no tremors or body movement noted during seizure.  MD Criss Alvine notified.

## 2021-02-01 NOTE — ED Notes (Addendum)
First contact with patient. Patient arrived via triage from home with complaints of right shoulder dislocation. Patient states he is "unsure" how it happened - states it has happened before. Obvious deformity noted to right shoulder. Circulation and movent in hands/fingers intact. Pt is A&OX 4. Respirations even/unlabored. Patient changed into gown and placed on cardiac monitor and call light within reach. Patient updated on plan of care. Will continue to monitor patient.   1256: Conscious sedation completed - Patient recovering well GCS 15 - VSS. Patient tolerated procedure well. Shoulder immobilizer in place. Circulation intact. Patient states he is working on a ride home. Report given to [employer] for continuation of care.   1304: Patient is fully A&OX4. Sedation monitoring complete

## 2021-02-01 NOTE — Discharge Instructions (Addendum)
Please follow-up with orthopedic surgery.  Please keep your arm in the sling for now until you follow-up with Ortho.  Should be in sling likely for at least 1 week.  Do not raise your arm above your head.  Take Tylenol or Motrin for pain.  Please follow-up with your neurologist regarding seizure management.  You should not be driving until you have been seizure-free for at least 6 months or otherwise instructed by your neurologist.

## 2021-02-01 NOTE — ED Notes (Signed)
RT Note: Conscious Sedation performed on R Shoulder with eTC02 in place, all Emergency Equipment available.

## 2021-02-01 NOTE — ED Triage Notes (Signed)
Right shoulder pain , Hx shoulder dislocation , woke up this morning with shoulder pain , limited ROM , obvious distress.

## 2021-02-01 NOTE — ED Triage Notes (Signed)
Patient bib gems, having seizures. History of seizures , non compliant with medications. Patient had seizure en route to hospital.

## 2021-02-01 NOTE — ED Provider Notes (Signed)
[facility]-EMERGENCY DEPT Provider Note   CSN: [id number] Arrival date & time: 02/01/21  1819     History Chief Complaint  Patient presents with   Seizures    Jason Davenport is a 30 y.o. male.  HPI 30 year old male presents via EMS after seizures.  History is from patient as well as the paramedic.  The patient has a known history of seizures and is supposed to be taking Keppra.  He tells me he has not had his Keppra in the last couple nights as he has forgotten to take it.  Otherwise, he was at [place] earlier today for a shoulder dislocation.  When asked how he dislocated, he states he thinks he slept on it wrong and woke up with it dislocated.  He does not think he had a seizure.  This afternoon, patient was resting and then woke up with paramedics around him and his roommate said he had a seizure.  While with EMS, he had a spell of lipsmacking and then seemed to stop responding for about 30 seconds or so, consistent with another seizure.  He has a slight headache and thinks he bit his left tongue.  However the headaches pretty typical whenever he has seizures.  His right shoulder is sore but still in place.  Past Medical History:  Diagnosis Date   Seizure [facility])     Patient Active Problem List   Diagnosis Date Noted   Seizures (HCC) 09/29/2018   Anterior dislocation of left shoulder 09/29/2018   Seizure (HCC) 08/17/2018   Anterior shoulder dislocation, left, initial encounter 08/17/2018    Past Surgical History:  Procedure Laterality Date   no past surgeries         Family History  Problem Relation Age of Onset   Hypertension Mother    Hypertension Father     Social History   Tobacco Use   Smoking status: Every Day    Packs/day: 0.25    Types: Cigarettes   Smokeless tobacco: Never  Vaping Use   Vaping Use: Never used  Substance Use Topics   Alcohol use: Yes   Drug use: Yes    Types: Marijuana    Home Medications Prior to Admission  medications   Medication Sig Start Date End Date Taking? Authorizing Provider  Levetiracetam (KEPPRA XR) 750 MG TB24 Take 1 tablet every night 01/03/21   Van Clines, MD    Allergies    Patient has no known allergies.  Review of Systems   Review of Systems  Musculoskeletal:  Positive for arthralgias.  Neurological:  Positive for seizures and headaches.  All other systems reviewed and are negative.  Physical Exam Updated Vital Signs BP 122/78    Pulse 63    Temp 98.2 F (36.8 C) (Oral)    Resp 19    Ht 6' (1.829 m)    Wt 72.6 kg    SpO2 98%    BMI 21.70 kg/m   Physical Exam Vitals and nursing note reviewed.  Constitutional:      General: He is not in acute distress.    Appearance: He is well-developed. He is not ill-appearing or diaphoretic.  HENT:     Head: Normocephalic and atraumatic.     Right Ear: External ear normal.     Left Ear: External ear normal.     Nose: Nose normal.     Mouth/Throat:     Comments: Mild left lateral tongue injury Eyes:  General:        Right eye: No discharge.        Left eye: No discharge.     Extraocular Movements: Extraocular movements intact.     Pupils: Pupils are equal, round, and reactive to light.  Cardiovascular:     Rate and Rhythm: Normal rate and regular rhythm.     Heart sounds: Normal heart sounds.  Pulmonary:     Effort: Pulmonary effort is normal.     Breath sounds: Normal breath sounds.  Abdominal:     General: There is no distension.     Palpations: Abdomen is soft.     Tenderness: There is no abdominal tenderness.  Musculoskeletal:     Cervical back: Normal range of motion and neck supple. No rigidity.     Comments: Right shoulder appears located. It is mildly tender  Skin:    General: Skin is warm and dry.  Neurological:     Mental Status: He is alert and oriented to person, place, and time.     Comments: CN 3-12 grossly intact. 5/5 strength in all 4 extremities. Grossly normal sensation.   Psychiatric:         Mood and Affect: Mood is not anxious.    ED Results / Procedures / Treatments   Labs (all labs ordered are listed, but only abnormal results are displayed) Labs Reviewed  COMPREHENSIVE METABOLIC PANEL - Abnormal; Notable for the following components:      Result Value   Sodium 134 (*)    Glucose, Bld 101 (*)    All other components within normal limits  CBC WITH DIFFERENTIAL/PLATELET - Abnormal; Notable for the following components:   WBC 12.3 (*)    Neutro Abs 11.2 (*)    Lymphs Abs 0.6 (*)    All other components within normal limits  RESP PANEL BY RT-PCR (FLU A&B, COVID) ARPGX2  MAGNESIUM  RAPID URINE DRUG SCREEN, HOSP PERFORMED    EKG EKG Interpretation  Date/Time:  Saturday February 01 2021 18:38:15 EST Ventricular Rate:  71 PR Interval:  139 QRS Duration: 103 QT Interval:  386 QTC Calculation: 420 R Axis:   98 Text Interpretation: Sinus rhythm Borderline right axis deviation  similar to Nov 2022 Confirmed by Pricilla Loveless [phone]) on 02/01/2021 6:46:16 PM  Radiology DG Shoulder Right  Result Date: 02/01/2021 CLINICAL DATA:  Awoke this morning with right shoulder pain. EXAM: RIGHT SHOULDER - 2+ VIEW COMPARISON:  01/02/2021. FINDINGS: Anterior dislocation, humeral head abutting the anterior margin of the glenoid. There is a small elliptical bone fragment that projects over the scapula, below the coracoid process, on the AP view, not visualized on the Y scapular view. This suggests a small fracture, likely from impaction of the posterosuperior humeral head, and is new compared to the prior exam. IMPRESSION: 1. Anterior right shoulder/humeral head dislocation. 2. Small fracture fragment that lies below the coracoid process of the scapula and medial to the humeral head. Exact origin of this fracture is not defined, but is suspected to be from impaction of the humeral head. Electronically Signed   By: Amie Portland M.D.   On: 02/01/2021 12:11   DG Shoulder Right  Portable  Result Date: 02/01/2021 CLINICAL DATA:  30 year old male with history of shoulder dislocation EXAM: PORTABLE RIGHT SHOULDER COMPARISON:  Earlier the same day FINDINGS: Anatomic alignment of the right glenohumeral joint post reduction. There is similar well-defined ellipsoid osseous fragment projecting just inferior to the scapular spine on the frontal view. No  obvious glenohumeral or humeral head fracture deformity noted. IMPRESSION: 1. Anatomic alignment status post shoulder reduction. 2. Small osseous fracture fragment is again seen projecting just inferior to the scapular spine on frontal projection. No obvious Hill-Sachs or bony Bankart abnormality on this limited evaluation. Electronically Signed   By: Marliss Coots M.D.   On: 02/01/2021 13:05    Procedures Procedures   Medications Ordered in ED Medications  levETIRAcetam (KEPPRA) IVPB 1000 mg/100 mL premix (0 mg Intravenous Stopped 02/01/21 1926)  acetaminophen (TYLENOL) tablet 1,000 mg (1,000 mg Oral Given 02/01/21 1857)  levETIRAcetam (KEPPRA) IVPB 500 mg/100 mL premix (0 mg Intravenous Stopped 02/01/21 1939)    ED Course  I have reviewed the triage vital signs and the nursing notes.  Pertinent labs & imaging results that were available during my care of the patient were reviewed by me and considered in my medical decision making ([facility] chart for details).  Clinical Course as of 02/01/21 2147  Sat Feb 01, 2021  1443 I was called to the bedside emergently because patient was having a seizure.  Nurse reports that he had lipsmacking and was nonresponsive.  Became diaphoretic.  He has broken out of it and now is back to baseline.  I suspect this was his third or more likely fourth seizure today.  Keppra is currently infusing.  Will discuss with neurology. [SG]  1908 I discussed with Dr. Derry Lory, who recommends additional 500 mg IV keppra (1500 mg total) and then will need his nightly dose before bed. Given multiple seizure in  cluster (without status), he recommends observation overnight. Doesn't need continuous EEG or [facility] admission.  [SG]    Clinical Course User Index [SG] Pricilla Loveless, MD   MDM Rules/Calculators/A&P                         Patient's lab work is overall unremarkable.  I discussed admission which patient initially agreed to but then changed his mind and wants to go home.  He seems understand risks of leaving and risks of recurrent seizures or deleterious effects of having multiple seizures.  He was advised he can return at any time.  Discharged home with return precautions.  I discussed he needs to take his seizure medicine tonight.    Final Clinical Impression(s) / ED Diagnoses Final diagnoses:  Seizures [facility])    Rx / DC Orders ED Discharge Orders     None        Pricilla Loveless, MD 02/01/21 2147

## 2021-03-04 ENCOUNTER — Telehealth: Payer: Self-pay | Admitting: Neurology

## 2021-03-04 MED ORDER — LEVETIRACETAM ER 500 MG PO TB24: ORAL_TABLET | ORAL | 2 refills | Status: DC

## 2021-03-04 NOTE — Addendum Note (Signed)
Addended by: Van Clines on: 03/04/2021 09:07 AM   Modules accepted: Orders

## 2021-03-04 NOTE — Telephone Encounter (Signed)
Recommend increasing the Keppra to 1000mg  qhs: He has Keppra XR, will change to 500mg : Take 2 tablets every night. 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

## 2021-03-04 NOTE — Telephone Encounter (Signed)
Pt c/o: seizure Missed medications?  No. Sleep deprived?  No. Alcohol intake?  No. Back to their usual baseline self?  Yes.  . If no, advise go to ER Any increase in stress? No Trigger? Over eating,  Current medications prescribed by Dr. Karel Jarvis: Levetiracetam 750mg  Take 1 tablet every night  Pt called advised that we do not treat him for passing out we treat his seizures. He would need to call his PCP to be cleared for work  that's when he said that he had a Seizure on Sunday morning while in the bed. Pt advised that Dr Wednesday is not in the office he stated he wanted to come in. Pt was told again Dr Karel Jarvis is not in the office she has something going on and she may or may not be in tomorrow, but I will send this to Dr Karel Jarvis and one of the other MDs in the office will see his call,

## 2021-03-04 NOTE — Telephone Encounter (Signed)
Patient passed out at work this past Sunday. He hurt his shoulder, he said he popped it back in place. His manager told him he will need a note to clear him to go back to work. He will need it to say light duty

## 2021-03-04 NOTE — Telephone Encounter (Signed)
Spoke with pt informed him that Dr Karel Jarvis Recommend increasing the Keppra to 1000mg  qhs: He has Keppra XR, will change to 500mg : Take 2 tablets every night.  what kind of work he does, cannot drive for 6 mos, operate heavy machinery, work from . he works at 

## 2021-03-05 ENCOUNTER — Telehealth: Payer: Self-pay | Admitting: Neurology

## 2021-03-05 NOTE — Telephone Encounter (Signed)
Patient called and said the note he got for work is wrong. It should have said without restrictions, but when I spoke with him, he said light duty work. He said he needs to be able to work and they wont let him back until it says that. Told him I would send message back but it wouldn't be today.

## 2021-03-05 NOTE — Telephone Encounter (Signed)
Pt called informed that letter is at the front desk for pick up

## 2021-03-05 NOTE — Telephone Encounter (Signed)
Patient called is scheduled with Karel Jarvis on 4/28.  He wanted to schedule for sooner appt.  He needs a note to return to work.

## 2021-03-05 NOTE — Telephone Encounter (Signed)
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 Recommend light duty due to recent shoulder injury. Can you pls write the note? We will put him on waitlist, thanks

## 2021-03-06 NOTE — Telephone Encounter (Signed)
Pt stated that he needs his letter to say light duty due to shoulder injury and it does, pt letter says what Dr Karel Jarvis told me to right, He is asking for something, different pt stated he has not tried given the letter to his employer. Pt advised that Dr Karel Jarvis is out of the office and she may be back on Monday and she will decide of she wants to change it,

## 2021-04-08 ENCOUNTER — Other Ambulatory Visit: Payer: Self-pay

## 2021-04-08 ENCOUNTER — Encounter (HOSPITAL_COMMUNITY): Payer: Self-pay

## 2021-04-08 ENCOUNTER — Emergency Department (HOSPITAL_COMMUNITY): Payer: BC Managed Care – PPO

## 2021-04-08 ENCOUNTER — Emergency Department (HOSPITAL_COMMUNITY)
Admission: EM | Admit: 2021-04-08 | Discharge: 2021-04-08 | Disposition: A | Discharge status: Home / Self Care | Payer: BC Managed Care – PPO | Attending: Emergency Medicine | Admitting: Emergency Medicine

## 2021-04-08 DIAGNOSIS — X58XXXA Exposure to other specified factors, initial encounter: Secondary | ICD-10-CM | POA: Insufficient documentation

## 2021-04-08 DIAGNOSIS — G40909 Epilepsy, unspecified, not intractable, without status epilepticus: Secondary | ICD-10-CM | POA: Insufficient documentation

## 2021-04-08 DIAGNOSIS — R569 Unspecified convulsions: Secondary | ICD-10-CM

## 2021-04-08 DIAGNOSIS — S01512A Laceration without foreign body of oral cavity, initial encounter: Secondary | ICD-10-CM | POA: Diagnosis not present

## 2021-04-08 LAB — I-STAT CHEM 8, ED
BUN: 6 mg/dL (ref 6–20)
Calcium, Ion: 1.19 mmol/L (ref 1.15–1.40)
Chloride: 108 mmol/L (ref 98–111)
Creatinine, Ser: 1.2 mg/dL (ref 0.61–1.24)
Glucose, Bld: 138 mg/dL — ABNORMAL HIGH (ref 70–99)
HCT: 50 % (ref 39.0–52.0)
Hemoglobin: 17 g/dL (ref 13.0–17.0)
Potassium: 3.8 mmol/L (ref 3.5–5.1)
Sodium: 141 mmol/L (ref 135–145)
TCO2: 23 mmol/L (ref 22–32)

## 2021-04-08 LAB — CBC WITH DIFFERENTIAL/PLATELET
Abs Immature Granulocytes: 0.09 10*3/uL — ABNORMAL HIGH (ref 0.00–0.07)
Basophils Absolute: 0.1 10*3/uL (ref 0.0–0.1)
Basophils Relative: 0 %
Eosinophils Absolute: 0.1 10*3/uL (ref 0.0–0.5)
Eosinophils Relative: 0 %
HCT: 51.4 % (ref 39.0–52.0)
Hemoglobin: 16.3 g/dL (ref 13.0–17.0)
Immature Granulocytes: 1 %
Lymphocytes Relative: 13 %
Lymphs Abs: 2.5 10*3/uL (ref 0.7–4.0)
MCH: 30 pg (ref 26.0–34.0)
MCHC: 31.7 g/dL (ref 30.0–36.0)
MCV: 94.7 fL (ref 80.0–100.0)
Monocytes Absolute: 1.1 10*3/uL — ABNORMAL HIGH (ref 0.1–1.0)
Monocytes Relative: 5 %
Neutro Abs: 15.9 10*3/uL — ABNORMAL HIGH (ref 1.7–7.7)
Neutrophils Relative %: 81 %
Platelets: 366 10*3/uL (ref 150–400)
RBC: 5.43 MIL/uL (ref 4.22–5.81)
RDW: 12.7 % (ref 11.5–15.5)
WBC: 19.6 10*3/uL — ABNORMAL HIGH (ref 4.0–10.5)
nRBC: 0 % (ref 0.0–0.2)

## 2021-04-08 LAB — COMPREHENSIVE METABOLIC PANEL
ALT: 23 U/L (ref 0–44)
AST: 45 U/L — ABNORMAL HIGH (ref 15–41)
Albumin: 4.8 g/dL (ref 3.5–5.0)
Alkaline Phosphatase: 114 U/L (ref 38–126)
Anion gap: 22 — ABNORMAL HIGH (ref 5–15)
BUN: 6 mg/dL (ref 6–20)
CO2: 11 mmol/L — ABNORMAL LOW (ref 22–32)
Calcium: 10 mg/dL (ref 8.9–10.3)
Chloride: 107 mmol/L (ref 98–111)
Creatinine, Ser: 1.32 mg/dL — ABNORMAL HIGH (ref 0.61–1.24)
GFR, Estimated: >60 mL/min (ref >60)
Glucose, Bld: 156 mg/dL — ABNORMAL HIGH (ref 70–99)
Potassium: 3.4 mmol/L — ABNORMAL LOW (ref 3.5–5.1)
Sodium: 140 mmol/L (ref 135–145)
Total Bilirubin: 0.7 mg/dL (ref 0.3–1.2)
Total Protein: 9.1 g/dL — ABNORMAL HIGH (ref 6.5–8.1)

## 2021-04-08 IMAGING — CR LEFT SHOULDER - 1 VIEW
4 series · 4 of 4 positions shown · non-contrast
Comparison: 07/23/2018

CLINICAL DATA: Concern for left shoulder dislocation today with
multiple prior dislocations in the past 6 months.

EXAM:
LEFT SHOULDER - 1 VIEW

[AP (1 of 4)]
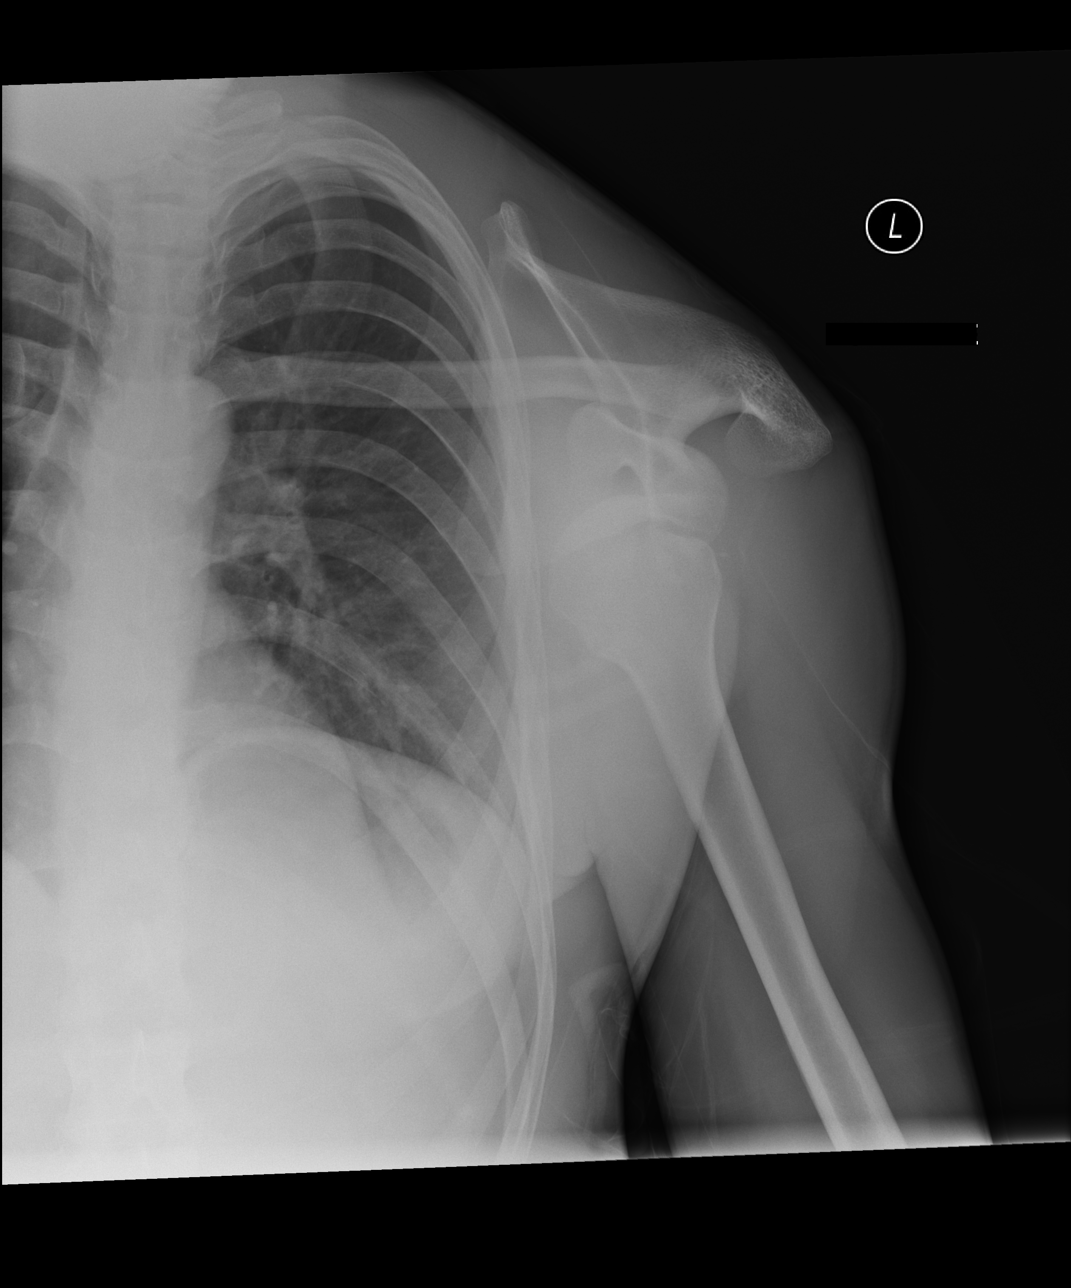

[AP (2 of 4)]
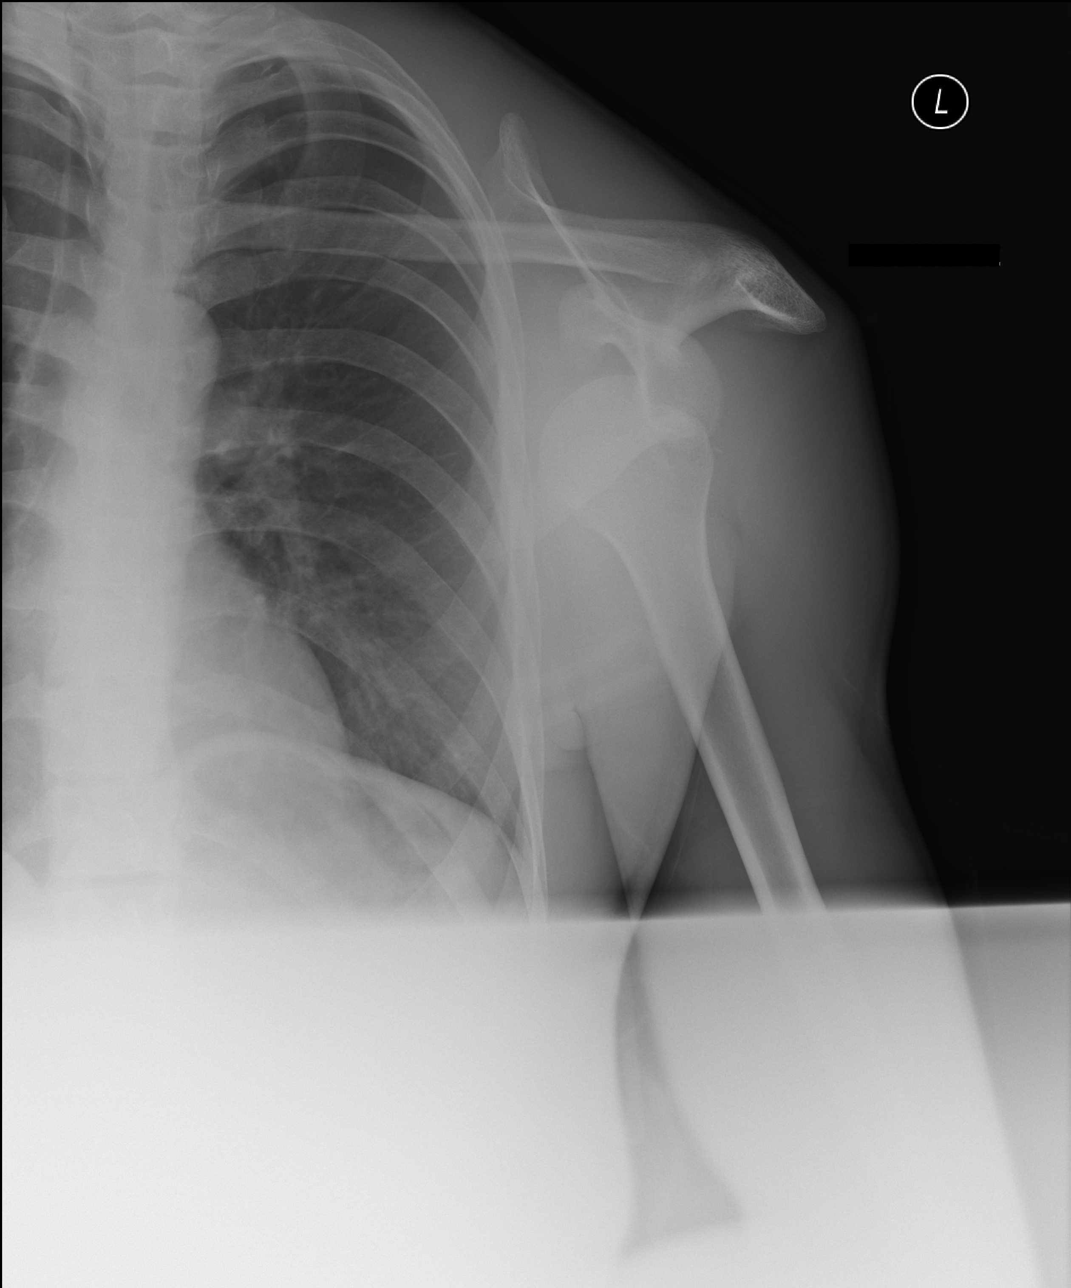

[AP (3 of 4)]
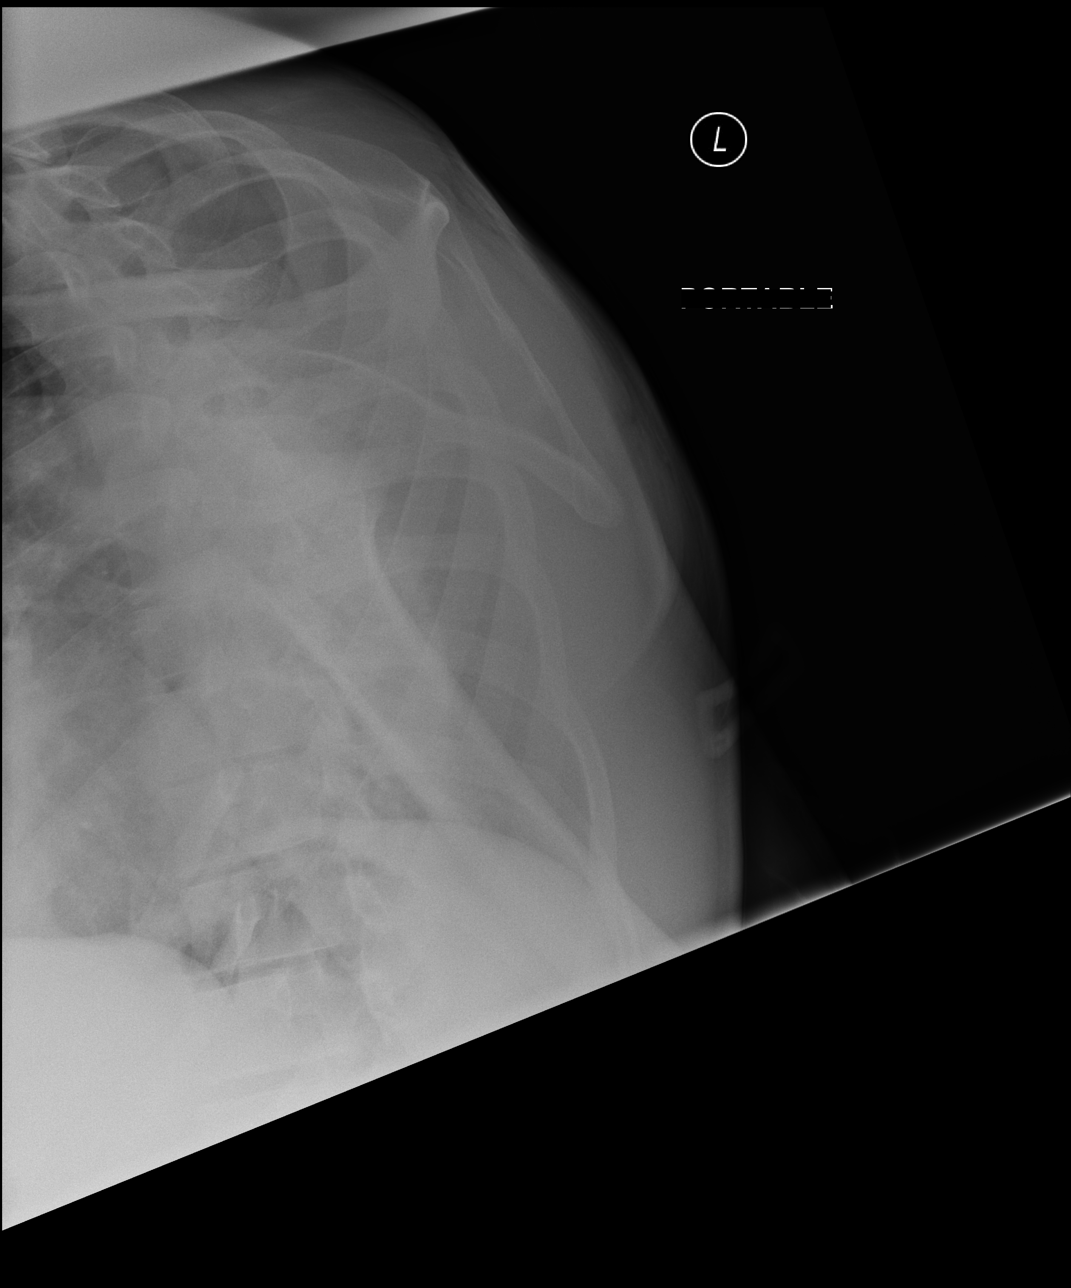

[AP (4 of 4)]
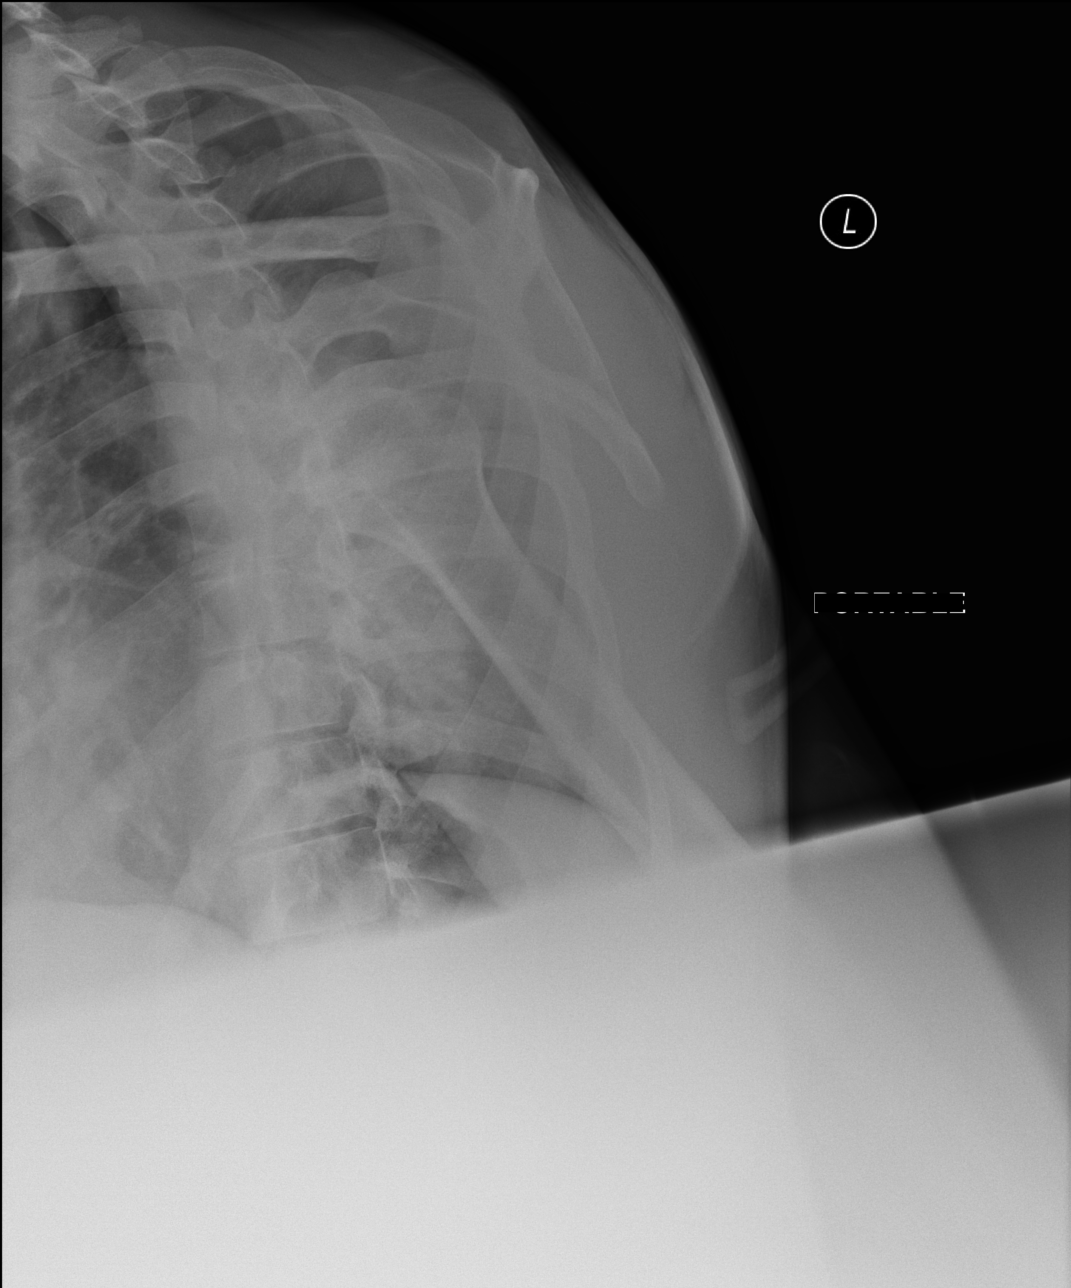

[4 of 4 positions shown; findings below may reference images not displayed]

FINDINGS: There is a recurrent anterior dislocation of the humeral head
relative to the glenoid. A chronic Hill-Sachs deformity is again
noted with an adjacent 2 mm focus of bone or soft tissue
calcification. No definite acute fracture is identified. The
visualized portion of the left lung is clear.
IMPRESSION: Recurrent anterior left glenohumeral dislocation.

## 2021-04-08 IMAGING — MR MRI HEAD WITHOUT AND WITH CONTRAST
13 of 15 series · 40 of 48 positions shown · IV contrast (gadavist)
Comparison: 08/17/2018 CT head.

CLINICAL DATA: 27 y/o  M; new diagnosis of seizure.

EXAM:
MRI HEAD WITHOUT AND WITH CONTRAST
TECHNIQUE: Multiplanar, multiecho pulse sequences of the brain and surrounding
structures were obtained without and with intravenous contrast.
CONTRAST:  6 cc Gadavist

[Series 5: DWI · axial · 3.0mm · 0.88mm/px · z∈[-35,+105]mm · 6 of 100 slices shown (1 of 4)]
[im 1/100]
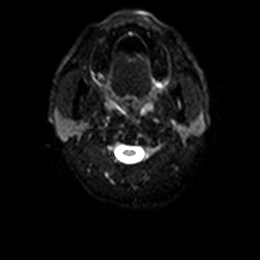
[im 20/100]
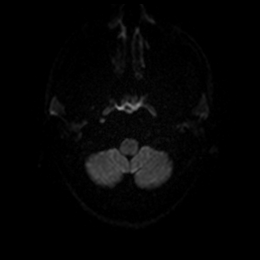
[im 40/100]
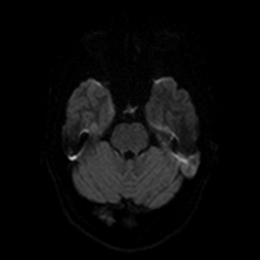
[im 60/100]
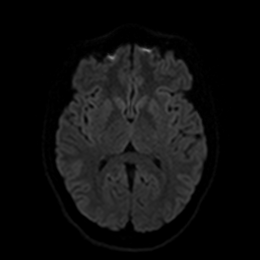
[im 80/100]
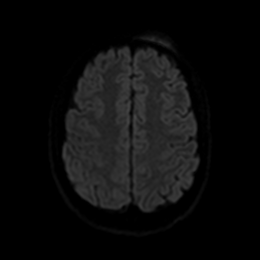
[im 100/100]
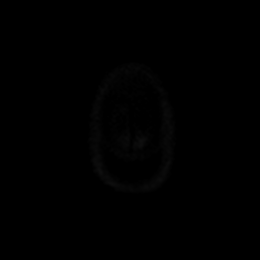

[Series 6: DWI · axial · 3.0mm · 0.88mm/px · z∈[-35,+105]mm · 3 of 49 slices shown (2 of 4)]
[im 1/49]
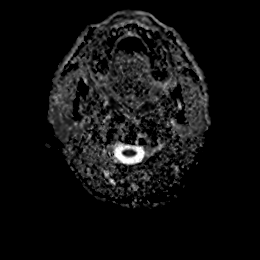
[im 25/49]
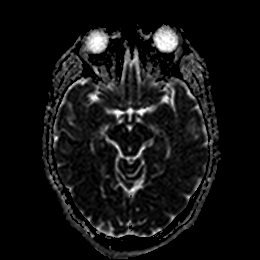
[im 49/49]
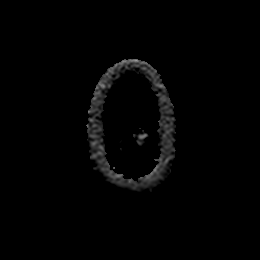

[Series 7: T1 · sagittal · 5.0mm · 0.78mm/px · 2 of 23 slices shown]
[im 1/23]
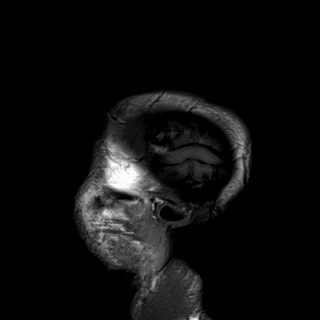
[im 23/23]
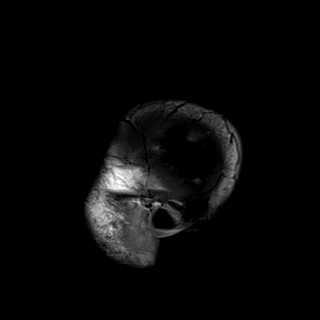

[Series 8: FLAIR · axial · 5.0mm · 0.45mm/px · z∈[-34,+103]mm · 2 of 25 slices shown (1 of 2)]
[im 1/25]
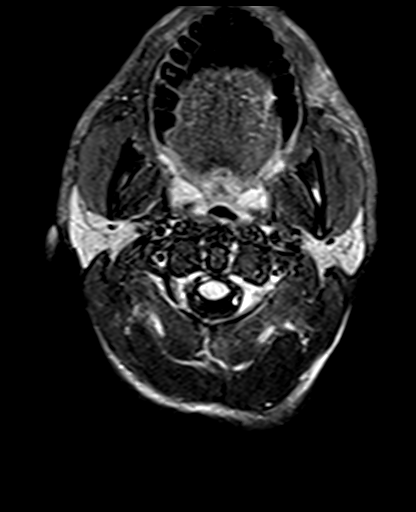
[im 25/25]
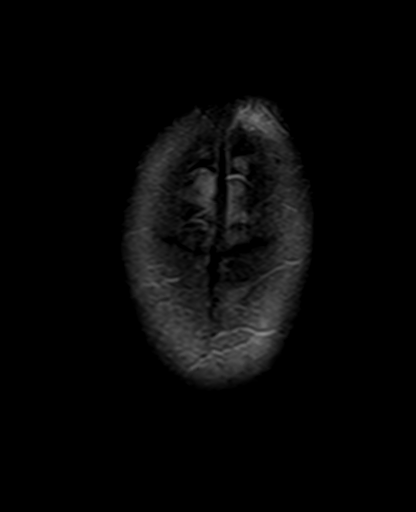

[Series 11: pha_images · axial · 3.0mm · 0.90mm/px · z∈[-39,+98]mm · 4 of 49 slices shown]
[im 1/49]
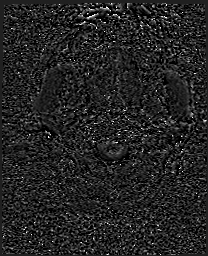
[im 17/49]
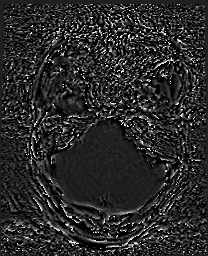
[im 33/49]
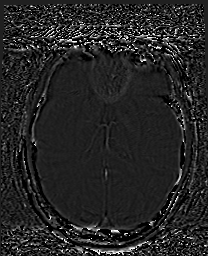
[im 49/49]
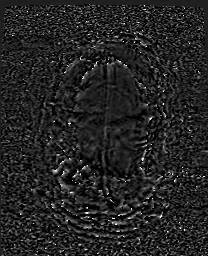

[Series 12: swi_images · axial · 3.0mm · 0.90mm/px · z∈[-39,+107]mm · 4 of 52 slices shown]
[im 1/52]
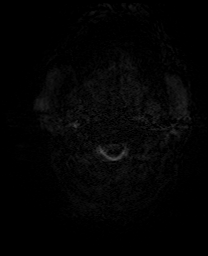
[im 18/52]
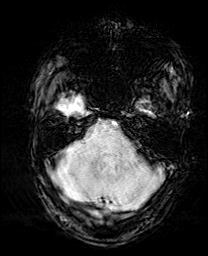
[im 35/52]
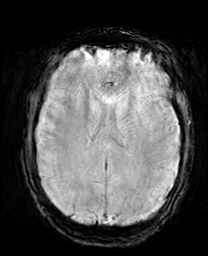
[im 52/52]
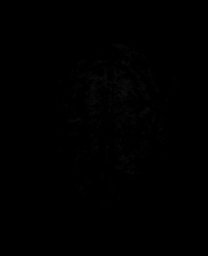

[Series 14: T2 · coronal · 3.0mm · 0.53mm/px · 3 of 35 slices shown (1 of 2)]
[im 1/35]
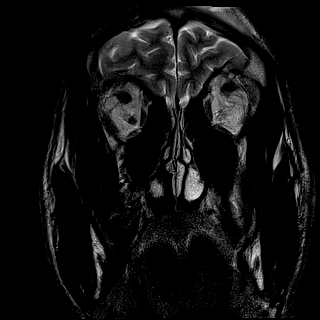
[im 18/35]
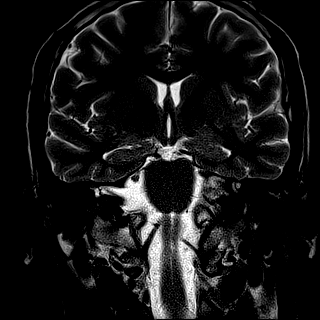
[im 35/35]
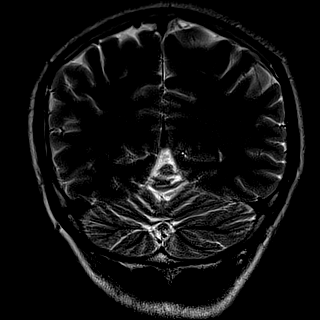

[Series 15: DWI · coronal · 4.0mm · 0.88mm/px · 5 of 68 slices shown (3 of 4)]
[im 1/68]
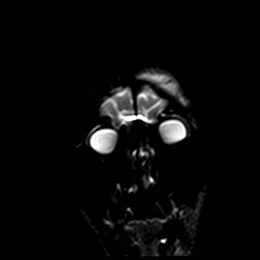
[im 17/68]
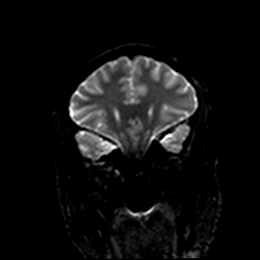
[im 34/68]
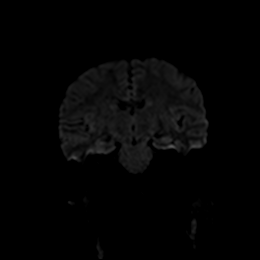
[im 51/68]
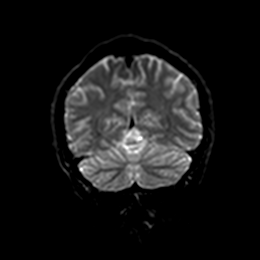
[im 68/68]
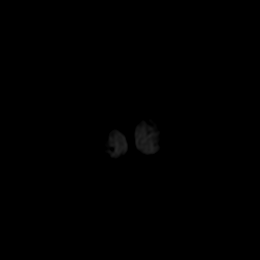

[Series 16: DWI · coronal · 4.0mm · 0.88mm/px · 2 of 34 slices shown (4 of 4)]
[im 1/34]
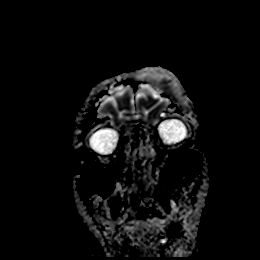
[im 34/34]
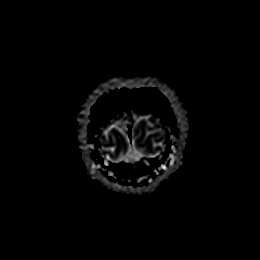

[Series 17: T2 · axial · 5.0mm · 0.72mm/px · z∈[-32,+106]mm · 2 of 25 slices shown (2 of 2)]
[im 1/25]
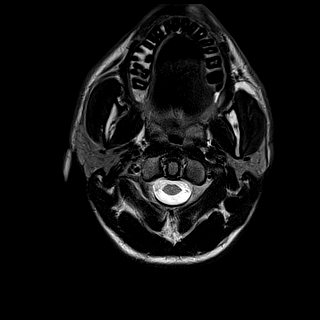
[im 25/25]
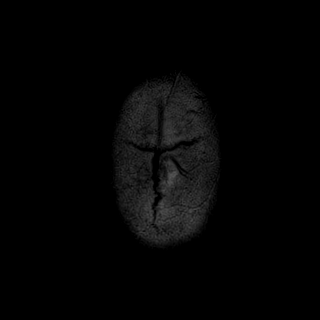

[Series 19: FLAIR · coronal · 3.0mm · 0.66mm/px · 3 of 35 slices shown (2 of 2)]
[im 1/35]
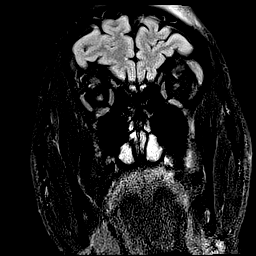
[im 18/35]
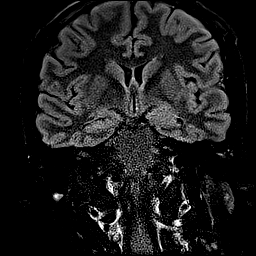
[im 35/35]
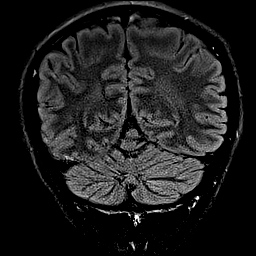

[Series 21: T2 post-contrast · coronal · 5.0mm · 0.72mm/px · 2 of 28 slices shown]
[im 1/28]
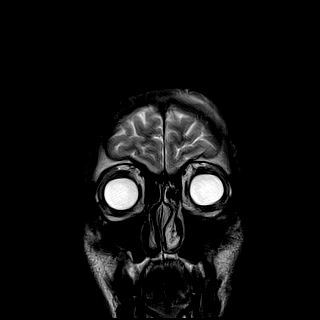
[im 28/28]
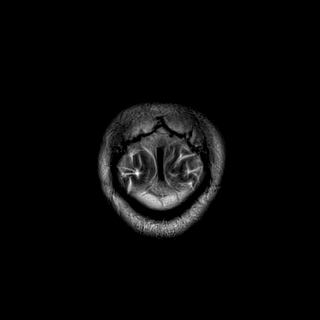

[Series 23: T1 post-contrast · coronal · 5.0mm · 0.34mm/px · 2 of 28 slices shown]
[im 1/28]
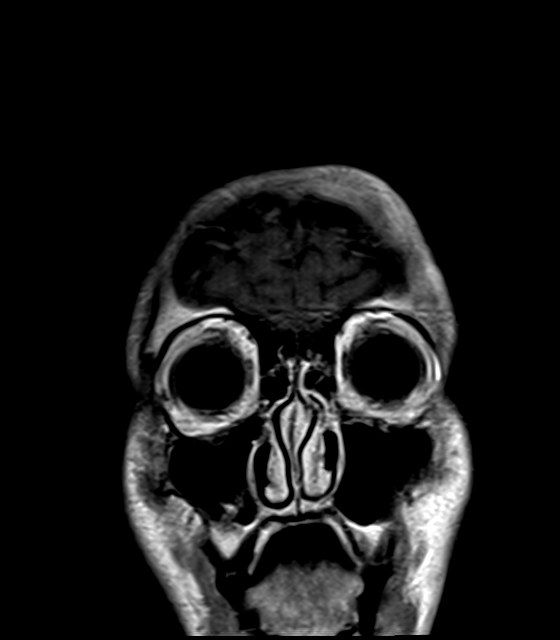
[im 28/28]
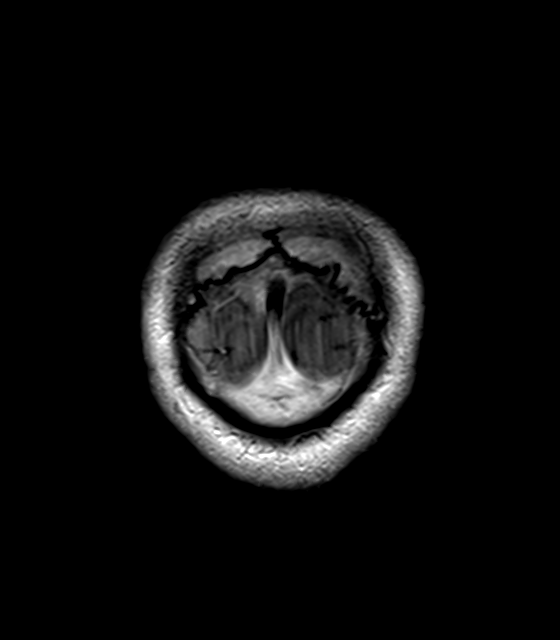

[40 of 48 positions shown; findings below may reference images not displayed]

FINDINGS: Brain: No acute infarction, hemorrhage, hydrocephalus, extra-axial
collection or mass lesion. Midline structures are intact including a
morphologically normal pituitary as well as a complete corpus
callosum and vermis. No Chiari malformation. No evidence of cortical
dysplasia, heterotopia, or disorder of cortical formation is
identified. Hippocampi by are symmetric in size and signal
bilaterally. After administration of intravenous contrast there is
no abnormal enhancement.

Vascular: Normal flow voids.

Skull and upper cervical spine: Normal marrow signal.

Sinuses/Orbits: Left frontal scalp swelling probably small
contusion. Small mucous retention cysts within the left maxillary
sinus. Otherwise negative.

Other: None.
IMPRESSION: 1. No acute process or structural cause of seizure identified. No
abnormal enhancement. Unremarkable MRI of the brain.
2. Small left frontal scalp contusion.

## 2021-04-08 MED ORDER — LEVETIRACETAM IN NACL 1500 MG/100ML IV SOLN
1500.0000 mg | INTRAVENOUS | Status: AC
  Administered 2021-04-08: 1500 mg via INTRAVENOUS
  Filled 2021-04-08: qty 100

## 2021-04-08 MED ORDER — LEVETIRACETAM ER 500 MG PO TB24: ORAL_TABLET | ORAL | 2 refills | Status: DC

## 2021-04-08 MED ORDER — LORAZEPAM 2 MG/ML IJ SOLN
2.0000 mg | Freq: Once | INTRAMUSCULAR | Status: AC
Start: 2021-04-08 — End: 2021-04-08

## 2021-04-08 MED ORDER — LORAZEPAM 2 MG/ML IJ SOLN
INTRAMUSCULAR | Status: AC
  Administered 2021-04-08: 2 mg via INTRAVENOUS
  Filled 2021-04-08: qty 1

## 2021-04-08 NOTE — Discharge Instructions (Addendum)
Please pick up your Keppra prescription today.  Follow-up with your neurologist.  As discussed do not drive or do any other dangerous activities.

## 2021-04-08 NOTE — ED Triage Notes (Signed)
Pt BIB [facility], pt has had 3 seizures since 0400 this AM. Per EMS, pt is noncompliant with seizure meds and refused transport x2. Pt has been spitting up blood with seizures. Pt a/o on arrival, appears in NAD. HR 90, BP 138/90, CBG 160.

## 2021-04-08 NOTE — ED Notes (Signed)
Seizure pads placed on the bed 

## 2021-04-08 NOTE — ED Provider Notes (Signed)
[facility] EMERGENCY DEPARTMENT Provider Note   CSN: [id number] Arrival date & time: 04/08/21  7121     History  Chief Complaint  Patient presents with   Seizures    Jason Davenport is a 31 y.o. male.  Patient had seizure this morning.  Possibly 2 or 3 times.  He is back at his baseline.  Denies any headache.  Denies any extremity pain.  Did bite his tongue.  Normal blood sugar with EMS.  History of seizures.  He is on Keppra.  He has not had his prescription for the last several days.  He had new prescription filled over a month ago.  He states that he takes Keppra 500 mg 24-hour tablet 2 tablets every night.  The history is provided by the patient.  Seizures Seizure activity on arrival: no   Return to baseline: yes   Severity:  Mild Context: medical non-compliance   Recent head injury:  No recent head injuries PTA treatment:  None History of seizures: yes       Home Medications Prior to Admission medications   Medication Sig Start Date End Date Taking? Authorizing Provider  levETIRAcetam (KEPPRA XR) 500 MG 24 hr tablet Take 2 tablets every night. 04/08/21   Raelin Pixler, DO      Allergies    Patient has no known allergies.    Review of Systems   Review of Systems  Neurological:  Positive for seizures.   Physical Exam Updated Vital Signs BP 126/90    Pulse 79    Temp 97.9 F (36.6 C) (Oral)    Resp 17    Ht 6' (1.829 m)    Wt 72.6 kg    SpO2 100%    BMI 21.70 kg/m  Physical Exam Vitals and nursing note reviewed.  Constitutional:      General: He is not in acute distress.    Appearance: He is well-developed. He is not ill-appearing.  HENT:     Head: Normocephalic and atraumatic.     Comments: Small laceration to the left lateral tongue Eyes:     Conjunctiva/sclera: Conjunctivae normal.  Cardiovascular:     Rate and Rhythm: Normal rate and regular rhythm.     Heart sounds: No murmur heard. Pulmonary:     Effort: Pulmonary effort is normal.  No respiratory distress.     Breath sounds: Normal breath sounds.  Abdominal:     Palpations: Abdomen is soft.     Tenderness: There is no abdominal tenderness.  Musculoskeletal:        General: No swelling.     Cervical back: Normal range of motion and neck supple.  Skin:    General: Skin is warm and dry.     Capillary Refill: Capillary refill takes less than 2 seconds.  Neurological:     General: No focal deficit present.     Mental Status: He is alert and oriented to person, place, and time.     Cranial Nerves: No cranial nerve deficit.     Sensory: No sensory deficit.     Motor: No weakness.     Coordination: Coordination normal.     Comments: 5+ out of 5 strength throughout, normal sensation, no drift, normal finger-nose-finger, normal speech  Psychiatric:        Mood and Affect: Mood normal.    ED Results / Procedures / Treatments   Labs (all labs ordered are listed, but only abnormal results are displayed) Labs Reviewed  CBC WITH  DIFFERENTIAL/PLATELET - Abnormal; Notable for the following components:      Result Value   WBC 19.6 (*)    Neutro Abs 15.9 (*)    Monocytes Absolute 1.1 (*)    Abs Immature Granulocytes 0.09 (*)    All other components within normal limits  COMPREHENSIVE METABOLIC PANEL - Abnormal; Notable for the following components:   Potassium 3.4 (*)    CO2 11 (*)    Glucose, Bld 156 (*)    Creatinine, Ser 1.32 (*)    Total Protein 9.1 (*)    AST 45 (*)    Anion gap 22 (*)    All other components within normal limits  I-STAT CHEM 8, ED - Abnormal; Notable for the following components:   Glucose, Bld 138 (*)    All other components within normal limits    EKG EKG Interpretation  Date/Time:  Tuesday April 08 2021 07:50:46 EST Ventricular Rate:  78 PR Interval:  148 QRS Duration: 96 QT Interval:  391 QTC Calculation: 446 R Axis:   95 Text Interpretation: Sinus rhythm Borderline right axis deviation Confirmed by Virgina Norfolk ([id number]) on  04/08/2021 8:03:08 AM  Radiology CT HEAD WO CONTRAST ( )  Result Date: 04/08/2021 CLINICAL DATA:  Altered mental status, head trauma, seizure EXAM: CT HEAD WITHOUT CONTRAST TECHNIQUE: Contiguous axial images were obtained from the base of the skull through the vertex without intravenous contrast. RADIATION DOSE REDUCTION: This exam was performed according to the departmental dose-optimization program which includes automated exposure control, adjustment of the mA and/or kV according to patient size and/or use of iterative reconstruction technique. COMPARISON:  10/22/2020 FINDINGS: Brain: No evidence of acute infarction, hemorrhage, hydrocephalus, extra-axial collection or mass lesion/mass effect. Vascular: No hyperdense vessel or unexpected calcification. Skull: Normal. Negative for fracture or focal lesion. Sinuses/Orbits: Retention cysts or polyps in the left maxillary sinus. No acute findings. Other: Dental caries and restoration. IMPRESSION: No acute findings Electronically Signed   By: Corlis Leak M.D.   On: 04/08/2021 09:58    Procedures Procedures    Medications Ordered in ED Medications  levETIRAcetam (KEPPRA) IVPB 1500 mg/ 100 mL premix (0 mg Intravenous Stopped 04/08/21 0821)  LORazepam (ATIVAN) injection 2 mg (2 mg Intravenous Given 04/08/21 0806)    ED Course/ Medical Decision Making/ A&P                           Medical Decision Making Amount and/or Complexity of Data Reviewed Labs: ordered. Radiology: ordered.  Risk Prescription drug management.   Jason Davenport is here after breakthrough seizures.  History of seizures on Keppra.  Has not been on the medication for the last several days.  Per chart review patient had prescription filled last month.  He states he has been out of the medication for the last 4 days which is consistent with his most recent prescription.  Multiple ED visits for the same.  He thinks may be 2-3 seizures here this morning.  None with EMS.  Does have a  small laceration to the left side of his mouth.  He appears to be back at baseline.  He is neurologically intact.  He does not think he hit his head.  He is on blood thinners.  EKG per my review and interpretation shows sinus rhythm.  No ischemic changes.  Blood sugar normal with EMS.  Shortly after arrival patient appeared to have another seizure episode lasting a little under a minute.  He  got 2 of Ativan.  He had IV Keppra load ordered prior to this event but had not been started yet.  Basic labs to be drawn.  Patient at this time is not seizing but not at his baseline.  We will continue to monitor.  Previous events like this in the past per chart review has show that patient has left AMA and not want to seek further medical care.  He seems to be stable and he is taking his medications however.  We will check basic labs and continue to monitor.  1045 head CT was ordered and per my review shows no obvious acute process.  Patient is improving but not quite yet to his baseline.  I talked with neurology on the phone and they are okay with discharge with him continuing his medications assuming that he returns to baseline does not have any other seizure-like activity.  12 pm, patient continues to improve but not quite back at his baseline.  He is awake and more alert and able to in the conversation.  He states that he is able to afford his medications.  He states that he is motivated to get this medication filled.  3 pm patient back to baseline.  Able to eat and drink without any issues.  Discharged in good condition.  Recommend that he continues not to drive or do any dangerous activities and follow-up closely with neurology.  Educated about seizures and the need to take his medications.  This chart was dictated using voice recognition software.  Despite best efforts to proofread,  errors can occur which can change the documentation meaning.         Final Clinical Impression(s) / ED Diagnoses Final  diagnoses:  Seizure [facility])    Rx / DC Orders ED Discharge Orders          Ordered    levETIRAcetam (KEPPRA XR) 500 MG 24 hr tablet        04/08/21 0845              Nakkia Mackiewicz, DO 04/08/21 1532

## 2021-04-10 ENCOUNTER — Other Ambulatory Visit: Payer: Self-pay

## 2021-04-10 ENCOUNTER — Encounter: Payer: Self-pay | Admitting: Neurology

## 2021-04-10 ENCOUNTER — Ambulatory Visit (INDEPENDENT_AMBULATORY_CARE_PROVIDER_SITE_OTHER): Payer: Self-pay | Admitting: Neurology

## 2021-04-10 VITALS — BP 112/68 | HR 85 | Ht 71.0 in | Wt 151.4 lb

## 2021-04-10 DIAGNOSIS — G40009 Localization-related (focal) (partial) idiopathic epilepsy and epileptic syndromes with seizures of localized onset, not intractable, without status epilepticus: Secondary | ICD-10-CM

## 2021-04-10 MED ORDER — ZONISAMIDE 100 MG PO CAPS: ORAL_CAPSULE | ORAL | 6 refills | Status: DC

## 2021-04-10 NOTE — Patient Instructions (Signed)
Good to see you.  Start Zonisamide 100mg : Take 1 capsule every night for 2 weeks, then increase to 2 capsules every night for 2 weeks, then increase to 3 capsules every night and continue  2. Continue the Keppra XR 500mg  daily for another 4 weeks, until you are taking the 3 capsules of the new medication and you can stop Keppra  3. Use an alarm and pillbox as a reminder for taking medication regularly  4. Follow-up in 3 months, call for any changes   Seizure Precautions: 1. If medication has been prescribed for you to prevent seizures, take it exactly as directed.  Do not stop taking the medicine without talking to your doctor first, even if you have not had a seizure in a long time.   2. Avoid activities in which a seizure would cause danger to yourself or to others.  Don't operate dangerous machinery, swim alone, or climb in high or dangerous places, such as on ladders, roofs, or girders.  Do not drive unless your doctor says you may.  3. If you have any warning that you may have a seizure, lay down in a safe place where you can't hurt yourself.    4.  No driving for 6 months from last seizure, as per [facility].   Please refer to the following link on the [place] website for more information: http://[email or web address].org/[email or web address]/driving/drivingu.cfm   5.  Maintain good sleep hygiene. Avoid alcohol.  6.  Contact your doctor if you have any problems that may be related to the medicine you are taking.  7.  Call 911 and bring the patient back to the ED if:        A.  The seizure lasts longer than 5 minutes.       B.  The patient doesn't awaken shortly after the seizure  C.  The patient has new problems such as difficulty seeing, speaking or moving  D.  The patient was injured during the seizure  E.  The patient has a temperature over 102 F (39C)  F.  The patient vomited and now is having trouble breathing

## 2021-04-10 NOTE — Progress Notes (Signed)
NEUROLOGY FOLLOW UP OFFICE NOTE  Jason Davenport [id number] 09-23-1990  HISTORY OF PRESENT ILLNESS: I had the pleasure of seeing Jason Davenport in follow-up in the neurology clinic on 04/10/2021.  The patient was last seen 4 months ago for epilepsy. He is alone in the office today. Records and images were personally reviewed where available.  On his last visit, he had seizures in the setting of stopping medication and restarted Levetiracetam ER 500mg  qhs. Since then, he has been to the ER 3 times for seizures, on 01/02/21 with right shoulder dislocation, 02/01/21 (missed Keppra doses), and most recently 04/08/2021. With seizure in 01/2021, EMS reported a witnessed spell of lipsmacking and unresponsiveness for 30 seconds, then another in the ER. Levetiracetam ER increased to 1000mg  qhs. On ER visit for seizure on 04/08/21, he again reported missing doses of medication. He had another seizure in the ER and was given Ativan. WBC was 19.6. Head CT no acute changes.   He reports today that he has only been taking Levetiracetam ER 500mg  qhs because as he previously mentioned, it makes him feel weird. He lives with his brother, brother's girlfriend and child, who have not mentioned any staring/lipsmacking episodes. He denies any olfactory/gustatory hallucinations, rising epigastric sensation, deja vu, focal numbness/tingling/weakness, myoclonic jerks. Both shoulders still bother him, L>R. He denies any sleep difficulties. He reports all the seizures he has had are nocturnal convulsions, he was surprised to hear about the episodes of unresponsiveness with lipsmacking.    History on Initial Assessment 08/17/2019: This is a pleasant 31 year old right-handed man presenting to establish care for seizures. His first seizure in 01/2018 was a nocturnal seizure. He had another seizure in 08/2018, then 09/2018 and was started on Levetiracetam 500mg  BID at that time. He had a left shoulder dislocation and has had issues with this  since then. He usually goes to the ER when he has a seizure and was in the ER on 02/08/19, then 06/17/19, and most recently 07/13/19 when he had 2 seizures in one day. With the seizures in May, he had stopped his Keppra previously. He states he just forgot to take it. He has been taking Levetiracetam ER 500mg  BID since May and denies any further seizures. He recalls taking 1000mg  BID at one point but he was not feeling himself. With the seizures, he usually feels like he is getting dehydrated, sweating really hard and feeling lightheaded, then having a GTC. Witnesses have reported he stares off first before a convulsion. He has bitten his tongue and dislocated left shoulder again with the seizures. He states surgery is on hold until seizures are controlled. He denies any gaps in time, olfactory/gustatory hallucinations, deja vu, rising epigastric sensation, focal numbness/tingling/weakness, myoclonic jerks. No headaches, diplopia, dysarthria/dysphagia, neck/back pain, bowel/bladder dysfunction. Memory is good. He denies drinking alcohol and denies any sleep difficulties. Mood is good. He is currently unemployed.   Epilepsy Risk Factors: He had a normal birth and early development.  There is no history of febrile convulsions, CNS infections such as meningitis/encephalitis, significant traumatic brain injury, neurosurgical procedures, or family history of seizures.  Diagnostic Data: EEGs: normal wake EEG in 08/2018 MRI: I personally reviewed MRI brain with and without contrast done 08/2018 which did not show any acute changes. Hippocampi symmetric with no abnormal signal or enhancement seen.   PAST MEDICAL HISTORY: Past Medical History:  Diagnosis Date   Seizure [facility])     MEDICATIONS: Current Outpatient Medications on File Prior to Visit  Medication  Sig Dispense Refill   levETIRAcetam (KEPPRA XR) 500 MG 24 hr tablet Take 2 tablets every night. 60 tablet 2   No current facility-administered medications  on file prior to visit.    ALLERGIES: No Known Allergies  FAMILY HISTORY: Family History  Problem Relation Age of Onset   Hypertension Mother    Hypertension Father     SOCIAL HISTORY: Social History   Socioeconomic History   Marital status: Single    Spouse name: Not on file   Number of children: 1   Years of education: some college   Highest education level: Not on file  Occupational History   Occupation: Not working right now  Tobacco Use   Smoking status: Every Day    Packs/day: 0.25    Types: Cigarettes   Smokeless tobacco: Never  Vaping Use   Vaping Use: Never used  Substance and Sexual Activity   Alcohol use: Not Currently   Drug use: Yes    Types: Marijuana   Sexual activity: Not on file  Other Topics Concern   Not on file  Social History Narrative   Right-handed.   3-4 cups caffeine per day.   Lives at home alone.   Social Determinants of Health   Financial Resource Strain: Not on file  Food Insecurity: Not on file  Transportation Needs: Not on file  Physical Activity: Not on file  Stress: Not on file  Social Connections: Not on file  Intimate Partner Violence: Not on file     PHYSICAL EXAM: Vitals:   04/10/21 1049  BP: 112/68  Pulse: 85  SpO2: 99%   General: No acute distress Head:  Normocephalic/atraumatic Skin/Extremities: No rash, no edema Neurological Exam: alert and awake. No aphasia or dysarthria. Fund of knowledge is appropriate.  Attention and concentration are normal.   Cranial nerves: Pupils equal, round. Extraocular movements intact with no nystagmus. Visual fields full.  No facial asymmetry.  Motor: Bulk and tone normal, muscle strength 5/5 throughout with no pronator drift.   Finger to nose testing intact.  Gait narrow-based and steady, able to tandem walk adequately.  Romberg negative.   IMPRESSION: This is a pleasant 31 yo RH man with a history of seizures since 2019. 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 MRI brain and routine EEG normal. He has had recurrent nocturnal convulsions (with shoulder dislocation) as well as focal impaired awareness seizures and cannot tolerate higher doses of Levetiracetam. We discussed starting Zonisamide 100mg  qhs x 2 weeks, then increase to 200mg  qhs x 2 weeks, then 300mg  qhs. Once he is on this dose, he can stop the Levetiracetam. Side effects discussed. We also discussed the importance of avoidance of seizure triggers, particularly missing medication. He is aware of [place] driving laws to stop driving until 6 month seizure-free. Follow-up in 3 months, he knows to call for any changes.    Thank you for allowing me to participate in his care.  Please do not hesitate to call for any questions or concerns.    , M.D.

## 2021-06-13 ENCOUNTER — Ambulatory Visit: Payer: 59 | Admitting: Neurology

## 2021-07-16 ENCOUNTER — Encounter: Payer: Self-pay | Admitting: Neurology

## 2021-07-16 ENCOUNTER — Telehealth (INDEPENDENT_AMBULATORY_CARE_PROVIDER_SITE_OTHER): Payer: Self-pay | Admitting: Neurology

## 2021-07-16 VITALS — Ht 71.0 in | Wt 160.0 lb

## 2021-07-16 DIAGNOSIS — M25512 Pain in left shoulder: Secondary | ICD-10-CM

## 2021-07-16 DIAGNOSIS — G40009 Localization-related (focal) (partial) idiopathic epilepsy and epileptic syndromes with seizures of localized onset, not intractable, without status epilepticus: Secondary | ICD-10-CM

## 2021-07-16 DIAGNOSIS — M25511 Pain in right shoulder: Secondary | ICD-10-CM

## 2021-07-16 MED ORDER — LEVETIRACETAM ER 500 MG PO TB24: ORAL_TABLET | ORAL | 3 refills | Status: DC

## 2021-07-16 NOTE — Progress Notes (Signed)
Virtual Visit via Video Note The purpose of this virtual visit is to provide medical care while limiting exposure to the novel coronavirus.    Consent was obtained for video visit:  Yes.   Answered questions that patient had about telehealth interaction:  Yes.   I discussed the limitations, risks, security and privacy concerns of performing an evaluation and management service by telemedicine. I also discussed with the patient that there may be a patient responsible charge related to this service. The patient expressed understanding and agreed to proceed.  Pt location: Home Physician Location: office Name of referring provider:  No ref. provider found I connected with Jason Davenport at patients initiation/request on 07/16/2021 at  3:00 PM EDT by video enabled telemedicine application and verified that I am speaking with the correct person using two identifiers. Pt MRN:  [id number] Pt DOB:  [birth year] Video Participants:  Jason Davenport   History of Present Illness:  The patient had a virtual video visit on 07/16/2021. He was last seen 3 months ago for epilepsy. He is alone for today's visit. On his last visit, we discussed addition of Zonisamide due to continued seizures on low dose Levetiracetam, and side effects on higher dose of Levetiracetam. He reports that he was unable to start the Zonisamide but has not had any seizures since 04/08/2021 while continuing the Levetiracetam ER 567m daily dose. He denies any staring/unresponsive episodes, gaps in time, olfactory/gustatory hallucinations, focal numbness/tingling/weakness, myoclonic jerks. He continues to deal with shoulder pain since shoulder dislocation with the prior seizures, his left shoulder "just feels completely gone, it pops out," and his right shoulder is "trying to act the same way." He otherwise feels fine and denies any headaches, dizziness, vision changes, no falls. He usually gets 8-10 hours of sleep.    History on Initial Assessment  08/17/2019: This is a pleasant 31year old right-handed man presenting to establish care for seizures. His first seizure in 01/2018 was a nocturnal seizure. He had another seizure in 08/2018, then 09/2018 and was started on Levetiracetam 5085mBID at that time. He had a left shoulder dislocation and has had issues with this since then. He usually goes to the ER when he has a seizure and was in the ER on 02/08/19, then 06/17/19, and most recently 07/13/19 when he had 2 seizures in one day. With the seizures in May, he had stopped his Keppra previously. He states he just forgot to take it. He has been taking Levetiracetam ER 50088mID since May and denies any further seizures. He recalls taking 1000m62mD at one point but he was not feeling himself. With the seizures, he usually feels like he is getting dehydrated, sweating really hard and feeling lightheaded, then having a GTC. Witnesses have reported he stares off first before a convulsion. He has bitten his tongue and dislocated left shoulder again with the seizures. He states surgery is on hold until seizures are controlled. He denies any gaps in time, olfactory/gustatory hallucinations, deja vu, rising epigastric sensation, focal numbness/tingling/weakness, myoclonic jerks. No headaches, diplopia, dysarthria/dysphagia, neck/back pain, bowel/bladder dysfunction. Memory is good. He denies drinking alcohol and denies any sleep difficulties. Mood is good. He is currently unemployed.   Epilepsy Risk Factors: He had a normal birth and early development.  There is no history of febrile convulsions, CNS infections such as meningitis/encephalitis, significant traumatic brain injury, neurosurgical procedures, or family history of seizures.  Diagnostic Data: EEGs: normal wake EEG in 08/2018 MRI: I personally reviewed MRI  brain with and without contrast done 08/2018 which did not show any acute changes. Hippocampi symmetric with no abnormal signal or enhancement seen.     Current Outpatient Medications on File Prior to Visit  Medication Sig Dispense Refill   levETIRAcetam (KEPPRA XR) 500 MG 24 hr tablet Take 2 tablets every night. (Patient taking differently: Take 1 tablets every night.) 60 tablet 2   zonisamide (ZONEGRAN) 100 MG capsule Take 1 capsule every night for 2 weeks, then increase to 2 capsules every night for 2 weeks, then increase to 3 capsules every night and continue (Patient not taking: Reported on 07/16/2021) 90 capsule 6   No current facility-administered medications on file prior to visit.     Observations/Objective:   Vitals:   07/16/21 0942  Weight: 160 lb (72.6 kg)  Height: '5\' 11"'  (1.803 m)   GEN:  The patient appears stated age and is in NAD.  Neurological examination: Patient is awake, alert. No aphasia or dysarthria. Intact fluency and comprehension.  Cranial nerves: Extraocular movements intact with no nystagmus. No facial asymmetry. Motor: moves all extremities symmetrically, at least anti-gravity x 4.    Assessment and Plan:   This is a pleasant 31 yo RH man with a history of seizures since 2019. 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 MRI brain and routine EEG normal. He denies any seizures since 03/2021. He could not tolerate higher doses of Levetiracetam but was unable to start the Zonisamide discussed on last visit. Since he has not had any seizures since 03/2021, he would like to continue on low dose Levetiracetam ER 562m qhs. We discussed that if seizures recur, would have to start a different ASM. He is aware of [place] driving laws to stop driving until 6 months seizure-free. He continues to have issues with bilateral shoulder pain and will be referred to Ortho. Follow-up in 6 months, call for any changes.    Follow Up Instructions:   -I discussed the assessment and treatment plan with the patient. The patient was provided an opportunity to ask questions and all were  answered. The patient agreed with the plan and demonstrated an understanding of the instructions.   The patient was advised to call back or seek an in-person evaluation if the symptoms worsen or if the condition fails to improve as anticipated.    KCameron Sprang MD

## 2021-07-21 ENCOUNTER — Other Ambulatory Visit: Payer: Self-pay

## 2021-07-21 DIAGNOSIS — M25511 Pain in right shoulder: Secondary | ICD-10-CM

## 2021-07-21 NOTE — Patient Instructions (Signed)
Good to see you.  Continue Levetiracetam ER 500mg  every night  2. Referral will be sent to Ortho for the shoulder pain  3. Follow-up in 6 months, call for any changes.    Seizure Precautions: 1. If medication has been prescribed for you to prevent seizures, take it exactly as directed.  Do not stop taking the medicine without talking to your doctor first, even if you have not had a seizure in a long time.   2. Avoid activities in which a seizure would cause danger to yourself or to others.  Don't operate dangerous machinery, swim alone, or climb in high or dangerous places, such as on ladders, roofs, or girders.  Do not drive unless your doctor says you may.  3. If you have any warning that you may have a seizure, lay down in a safe place where you can't hurt yourself.    4.  No driving for 6 months from last seizure, as per [facility].   Please refer to the following link on the Epile[employer] of [employer]'s website for more information: http://www.epilepsyfoundation.org/answerplace/Social/driving/drivingu.cfm   5.  Maintain good sleep hygiene. Avoid alcohol.  6.  Contact your doctor if you have any problems that may be related to the medicine you are taking.  7.  Call 911 and bring the patient back to the ED if:        A.  The seizure lasts longer than 5 minutes.       B.  The patient doesn't awaken shortly after the seizure  C.  The patient has new problems such as difficulty seeing, speaking or moving  D.  The patient was injured during the seizure  E.  The patient has a temperature over 102 F (39C)  F.  The patient vomited and now is having trouble breathing

## 2022-02-17 ENCOUNTER — Ambulatory Visit: Payer: Self-pay | Admitting: Neurology

## 2022-03-07 ENCOUNTER — Other Ambulatory Visit: Payer: Self-pay

## 2022-03-07 ENCOUNTER — Emergency Department (HOSPITAL_COMMUNITY)
Admission: EM | Admit: 2022-03-07 | Discharge: 2022-03-07 | Disposition: A | Discharge status: Home / Self Care | Payer: Self-pay | Attending: Emergency Medicine | Admitting: Emergency Medicine

## 2022-03-07 ENCOUNTER — Other Ambulatory Visit: Payer: Self-pay | Admitting: Neurology

## 2022-03-07 DIAGNOSIS — D72829 Elevated white blood cell count, unspecified: Secondary | ICD-10-CM | POA: Insufficient documentation

## 2022-03-07 DIAGNOSIS — X58XXXA Exposure to other specified factors, initial encounter: Secondary | ICD-10-CM | POA: Insufficient documentation

## 2022-03-07 DIAGNOSIS — S01511A Laceration without foreign body of lip, initial encounter: Secondary | ICD-10-CM | POA: Insufficient documentation

## 2022-03-07 DIAGNOSIS — R569 Unspecified convulsions: Secondary | ICD-10-CM | POA: Insufficient documentation

## 2022-03-07 LAB — CBC
HCT: 50.4 % (ref 39.0–52.0)
Hemoglobin: 16.3 g/dL (ref 13.0–17.0)
MCH: 29.8 pg (ref 26.0–34.0)
MCHC: 32.3 g/dL (ref 30.0–36.0)
MCV: 92.1 fL (ref 80.0–100.0)
Platelets: 366 10*3/uL (ref 150–400)
RBC: 5.47 MIL/uL (ref 4.22–5.81)
RDW: 12.4 % (ref 11.5–15.5)
WBC: 26.3 10*3/uL — ABNORMAL HIGH (ref 4.0–10.5)
nRBC: 0 % (ref 0.0–0.2)

## 2022-03-07 LAB — BASIC METABOLIC PANEL
Anion gap: 18 — ABNORMAL HIGH (ref 5–15)
BUN: 13 mg/dL (ref 6–20)
CO2: 17 mmol/L — ABNORMAL LOW (ref 22–32)
Calcium: 9.7 mg/dL (ref 8.9–10.3)
Chloride: 105 mmol/L (ref 98–111)
Creatinine, Ser: 1.41 mg/dL — ABNORMAL HIGH (ref 0.61–1.24)
GFR, Estimated: >60 mL/min (ref >60)
Glucose, Bld: 130 mg/dL — ABNORMAL HIGH (ref 70–99)
Potassium: 3.1 mmol/L — ABNORMAL LOW (ref 3.5–5.1)
Sodium: 140 mmol/L (ref 135–145)

## 2022-03-07 LAB — CBG MONITORING, ED
Glucose-Capillary: 68 mg/dL — ABNORMAL LOW (ref 70–99)
Glucose-Capillary: 91 mg/dL (ref 70–99)
Glucose-Capillary: 135 mg/dL — ABNORMAL HIGH (ref 70–99)

## 2022-03-07 MED ORDER — LEVETIRACETAM IN NACL 1500 MG/100ML IV SOLN
1500.0000 mg | Freq: Once | INTRAVENOUS | Status: AC
Start: 2022-03-07 — End: 2022-03-07
  Administered 2022-03-07: 1500 mg via INTRAVENOUS
  Filled 2022-03-07: qty 100

## 2022-03-07 MED ORDER — SODIUM CHLORIDE 0.9 % IV BOLUS (SEPSIS)
1000.0000 mL | Freq: Once | INTRAVENOUS | Status: AC
  Administered 2022-03-07: 1000 mL via INTRAVENOUS

## 2022-03-07 MED ORDER — SODIUM CHLORIDE 0.9 % IV SOLN
1000.0000 mL | INTRAVENOUS | Status: DC
  Administered 2022-03-07: 1000 mL via INTRAVENOUS

## 2022-03-07 MED ORDER — POTASSIUM CHLORIDE CRYS ER 20 MEQ PO TBCR
40.0000 meq | EXTENDED_RELEASE_TABLET | Freq: Once | ORAL | Status: AC
  Administered 2022-03-07: 40 meq via ORAL
  Filled 2022-03-07: qty 2

## 2022-03-07 MED ORDER — ACETAMINOPHEN 325 MG PO TABS
650.0000 mg | ORAL_TABLET | Freq: Once | ORAL | Status: AC
  Administered 2022-03-07: 650 mg via ORAL
  Filled 2022-03-07: qty 2

## 2022-03-07 MED ORDER — SODIUM CHLORIDE 0.9 % IV BOLUS
1000.0000 mL | Freq: Once | INTRAVENOUS | Status: AC
  Administered 2022-03-07: 1000 mL via INTRAVENOUS

## 2022-03-07 NOTE — ED Triage Notes (Signed)
Pt bib [facility] from home. Hx of seizures. On Keppra that ran out today. Pt had 5 witnessed seizures. 911 called on 4th seizure. Seizures approx 30 min apart. Oral trauma observed.  108/58 80HR 99% 18RR

## 2022-03-07 NOTE — ED Provider Notes (Signed)
[facility] Provider Note  CSN: [id number] Arrival date & time: 03/07/22 1308  Chief Complaint(s) Seizures  HPI Jason Davenport is a 32 y.o. male with a past medical history listed below including seizures on Keppra who presents to the emergency department for recurrent seizures.  Patient has reportedly been compliant with his Keppra but ran out yesterday and has not had a chance to pick up his refill today.  He reportedly had 4 seizures at home prompting a call to EMS but each time patient returned to baseline and refused transportation.  During the last seizure, patient was able to be coached to present.  Upon arrival to the emergency department and while waiting to be registered, he had another tonic-clonic seizure lasting less than 30 seconds. Now post ictal.  HPI  Past Medical History Past Medical History:  Diagnosis Date   Seizure [facility])    Patient Active Problem List   Diagnosis Date Noted   Seizures ([place]) 09/29/2018   Anterior dislocation of left shoulder 09/29/2018   Seizure ([place]) 08/17/2018   Anterior shoulder dislocation, left, initial encounter 08/17/2018   Home Medication(s) Prior to Admission medications   Medication Sig Start Date End Date Taking? Authorizing Provider  levETIRAcetam (KEPPRA XR) 500 MG 24 hr tablet Take 1 tablet every night 07/16/21   Cameron Sprang, MD                                                                                                                                    Allergies Patient has no known allergies.  Review of Systems Review of Systems As noted in HPI  Physical Exam Vital Signs  I have reviewed the triage vital signs BP 116/73 (BP Location: Right Arm)   Pulse 90   Temp (!) 97.4 F (36.3 C) (Oral)   Resp (!) 24   Ht 6' (1.829 m)   Wt 72.6 kg   SpO2 99%   BMI 21.70 kg/m   Physical Exam Vitals reviewed.  Constitutional:      General: He is not in acute distress.     Appearance: He is well-developed. He is not diaphoretic.  HENT:     Head: Normocephalic and atraumatic.     Comments: Facial petechia    Right Ear: External ear normal.     Left Ear: External ear normal.     Nose: Nose normal.     Mouth/Throat:     Mouth: Mucous membranes are moist.     Comments: Superficial lip lacerations. Eyes:     General: No scleral icterus.    Conjunctiva/sclera: Conjunctivae normal.  Neck:     Trachea: Phonation normal.  Cardiovascular:     Rate and Rhythm: Normal rate and regular rhythm.  Pulmonary:     Effort: Pulmonary effort is normal. No respiratory distress.     Breath sounds: No stridor.  Abdominal:  General: There is no distension.  Musculoskeletal:        General: Normal range of motion.     Cervical back: Normal range of motion.  Neurological:     Mental Status: He is unresponsive.     Comments: Post ictal. Localizes to painful stimuli  Psychiatric:        Behavior: Behavior normal.     ED Results and Treatments Labs (all labs ordered are listed, but only abnormal results are displayed) Labs Reviewed  CBC - Abnormal; Notable for the following components:      Result Value   WBC 26.3 (*)    All other components within normal limits  BASIC METABOLIC PANEL - Abnormal; Notable for the following components:   Potassium 3.1 (*)    CO2 17 (*)    Glucose, Bld 130 (*)    Creatinine, Ser 1.41 (*)    Anion gap 18 (*)    All other components within normal limits  CBG MONITORING, ED                                                                                                                         EKG  EKG Interpretation  Date/Time:  Saturday March 07 2022 05:30:30 EST Ventricular Rate:  85 PR Interval:  153 QRS Duration: 103 QT Interval:  388 QTC Calculation: 462 R Axis:   86 Text Interpretation: Sinus rhythm Confirmed by Addison Lank ([id number]) on 03/07/2022 5:42:32 AM       Radiology No results found.  Medications  Ordered in ED Medications  sodium chloride 0.9 % bolus 1,000 mL (1,000 mLs Intravenous New Bag/Given 03/07/22 0545)    Followed by  0.9 %  sodium chloride infusion (has no administration in time range)  sodium chloride 0.9 % bolus 1,000 mL (has no administration in time range)  levETIRAcetam (KEPPRA) IVPB 1500 mg/ 100 mL premix (0 mg Intravenous Stopped 03/07/22 5397)                                                                                                                                     Procedures Procedures  (including critical care time)  Medical Decision Making / ED Course   Medical Decision Making Amount and/or Complexity of Data Reviewed Labs: ordered. Decision-making details documented in ED Course. ECG/medicine tests: ordered and independent interpretation performed. Decision-making details documented in ED Course.  Risk Prescription  drug management.    Seizure, tonic clonic 4 at home, <30s with RTB in a few minutes. 1 episode upon arrival Likely due to not taking Keppra today Will get basic labs, give IVF, and Keppra load. Will monitor for RTB.  Leukocytosis and acidosis due to seizure.   On reassessment, patient's mental status is improving though still not at baseline.  Patient care turned over to oncoming provider. Patient case and results discussed in detail; please see their note for further ED managment.          Final Clinical Impression(s) / ED Diagnoses Final diagnoses:  None           This chart was dictated using voice recognition software.  Despite best efforts to proofread,  errors can occur which can change the documentation meaning.    Nira Conn, MD 03/07/22 [phone]

## 2022-03-07 NOTE — ED Provider Notes (Signed)
Patient is found to be by Dr. Leonette Monarch, pending his workup.  Patient is now alert.  Had a period of hypoglycemia was treated and blood sugar stabilized.  Suspect that patient's seizures are from lack of compliance.  Will discharge home   Lacretia Leigh, MD 03/07/22 1140

## 2022-03-09 ENCOUNTER — Telehealth: Payer: Self-pay

## 2022-03-09 MED ORDER — LEVETIRACETAM ER 500 MG PO TB24: ORAL_TABLET | ORAL | 0 refills | Status: DC

## 2022-03-09 NOTE — Telephone Encounter (Signed)
MEDICATION: levETIRAcetam (KEPPRA XR) 500 MG 24 hr tablet   PHARMACY:[id number], [place], [place] [id number]  Comments: Patient has two pills left. Scheduled an appt for this Thursday.   **Let patient know to contact pharmacy at the end of the day to make sure medication is ready. **  ** Please notify patient to allow 48-72 hours to process**  **Encourage patient to contact the pharmacy for refills or they can request refills through [facility]**

## 2022-03-09 NOTE — Telephone Encounter (Signed)
Refill sent in for pt. 

## 2022-03-12 ENCOUNTER — Telehealth (INDEPENDENT_AMBULATORY_CARE_PROVIDER_SITE_OTHER): Payer: Self-pay | Admitting: Neurology

## 2022-03-12 ENCOUNTER — Telehealth: Payer: Self-pay | Admitting: Neurology

## 2022-03-12 ENCOUNTER — Encounter: Payer: Self-pay | Admitting: Neurology

## 2022-03-12 VITALS — Ht 71.0 in | Wt 160.0 lb

## 2022-03-12 DIAGNOSIS — G40009 Localization-related (focal) (partial) idiopathic epilepsy and epileptic syndromes with seizures of localized onset, not intractable, without status epilepticus: Secondary | ICD-10-CM

## 2022-03-12 MED ORDER — OXCARBAZEPINE 300 MG PO TABS: ORAL_TABLET | ORAL | 11 refills | Status: DC

## 2022-03-12 NOTE — Patient Instructions (Signed)
Good to see you.  Start oxcarbazepine (Trileptal) 300mg : Take 1 tablet twice a day for 1 week, then increase to 2 tablets twice a day  2. Continue Keppra XR 500mg  every night for another 2 weeks as you start the new medication, then once taking 2 tablets twice a day of the new medication, you can stop the Keppra  3. Follow-up in 3-4 months, call for any changes.    Seizure Precautions: 1. If medication has been prescribed for you to prevent seizures, take it exactly as directed.  Do not stop taking the medicine without talking to your doctor first, even if you have not had a seizure in a long time.   2. Avoid activities in which a seizure would cause danger to yourself or to others.  Don't operate dangerous machinery, swim alone, or climb in high or dangerous places, such as on ladders, roofs, or girders.  Do not drive unless your doctor says you may.  3. If you have any warning that you may have a seizure, lay down in a safe place where you can't hurt yourself.    4.  No driving for 6 months from last seizure, as per [facility].   Please refer to the following link on the [place] website for more information: http://www.epilepsyfoundation.org/answerplace/Social/driving/drivingu.cfm   5.  Maintain good sleep hygiene. Avoid alcohol.  6.  Contact your doctor if you have any problems that may be related to the medicine you are taking.  7.  Call 911 and bring the patient back to the ED if:        A.  The seizure lasts longer than 5 minutes.       B.  The patient doesn't awaken shortly after the seizure  C.  The patient has new problems such as difficulty seeing, speaking or moving  D.  The patient was injured during the seizure  E.  The patient has a temperature over 102 F (39C)  F.  The patient vomited and now is having trouble breathing

## 2022-03-12 NOTE — Telephone Encounter (Signed)
Will discuss on f/u. He had sz last weekend, driving restrictions apply.

## 2022-03-12 NOTE — Progress Notes (Signed)
Virtual Visit via Video Note The purpose of this virtual visit is to provide medical care while limiting exposure to the novel coronavirus.    Consent was obtained for video visit:  Yes.   Answered questions that patient had about telehealth interaction:  Yes.   I discussed the limitations, risks, security and privacy concerns of performing an evaluation and management service by telemedicine. I also discussed with the patient that there may be a patient responsible charge related to this service. The patient expressed understanding and agreed to proceed.  Pt location: Home Physician Location: office Name of referring provider:  No ref. provider found I connected with Jason Davenport at patients initiation/request on 03/12/2022 at  3:00 PM EST by video enabled telemedicine application and verified that I am speaking with the correct person using two identifiers. Pt MRN:  [id number] Pt DOB:  [birth year] Video Participants:  Jason Davenport   History of Present Illness:  The patient had a virtual video visit on 03/19/2022. He was last seen in the neurology clinic 9 months ago for seizures. Since his last visit, he had a seizure on 09/22/21 lasting 4 minutes with tongue bite and bilateral shoulder pain. He reported being out of Levetiracetam for 2 days. On 03/07/22, he reported taking medication but ran out the day prior and had 4 seizures at home. He had another GTC in the ER lasting less than 30 seconds. WBC was 26.3, creatinine 1.41. His glucose was initially noted to be 68, then normalized. He denies any prior warning symptoms. No recent infections, alcohol, or sleep deprviations. He may have missed 1 or 2 doses in the month but states he was taking medication every night. No staring/unresponsive episodes. He denies any headaches, dizziness. He had side effects on higher doses of Levetiracetam ER and has opted to stay on 500mg  qhs.   History on Initial Assessment 08/17/2019: This is a pleasant 32 year old  right-handed man presenting to establish care for seizures. His first seizure in 01/2018 was a nocturnal seizure. He had another seizure in 08/2018, then 09/2018 and was started on Levetiracetam 500mg  BID at that time. He had a left shoulder dislocation and has had issues with this since then. He usually goes to the ER when he has a seizure and was in the ER on 02/08/19, then 06/17/19, and most recently 07/13/19 when he had 2 seizures in one day. With the seizures in May, he had stopped his Keppra previously. He states he just forgot to take it. He has been taking Levetiracetam ER 500mg  BID since May and denies any further seizures. He recalls taking 1000mg  BID at one point but he was not feeling himself. With the seizures, he usually feels like he is getting dehydrated, sweating really hard and feeling lightheaded, then having a GTC. Witnesses have reported he stares off first before a convulsion. He has bitten his tongue and dislocated left shoulder again with the seizures. He states surgery is on hold until seizures are controlled. He denies any gaps in time, olfactory/gustatory hallucinations, deja vu, rising epigastric sensation, focal numbness/tingling/weakness, myoclonic jerks. No headaches, diplopia, dysarthria/dysphagia, neck/back pain, bowel/bladder dysfunction. Memory is good. He denies drinking alcohol and denies any sleep difficulties. Mood is good. He is currently unemployed.   Epilepsy Risk Factors: He had a normal birth and early development.  There is no history of febrile convulsions, CNS infections such as meningitis/encephalitis, significant traumatic brain injury, neurosurgical procedures, or family history of seizures.  Diagnostic Data: EEGs: normal  wake EEG in 08/2018 MRI: I personally reviewed MRI brain with and without contrast done 08/2018 which did not show any acute changes. Hippocampi symmetric with no abnormal signal or enhancement seen.    Current Outpatient Medications on File Prior  to Visit  Medication Sig Dispense Refill   levETIRAcetam (KEPPRA XR) 500 MG 24 hr tablet Take 1 tablet every night 90 tablet 0   No current facility-administered medications on file prior to visit.     Observations/Objective:   Vitals:   03/12/22 1400  Weight: 160 lb (72.6 kg)  Height: 5\' 11"  (1.803 m)   GEN:  The patient appears stated age and is in NAD.  Neurological examination: Patient is awake, alert. No aphasia or dysarthria. Intact fluency and comprehension.Cranial nerves: Extraocular movements intac. No facial asymmetry. Motor: moves all extremities symmetrically, at least anti-gravity x 4.   Assessment and Plan:   This is a pleasant 32 yo RH man with a history of seizures since 2019. 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 MRI brain and routine EEG normal. He continues to have seizures on low dose Levetiracetam ER 500mg  qhs but had side effects on higher dose. We discussed at this point adding on another ASM, start oxcarbazepine 300mg  BID x 1 week, then increase to 600mg  BID. Side effects discussed. Continue Levetiracetam 500mg  qhs for another 2 weeks, then once on 600mg  BID oxcarbazepine, he can stop LEV. He is aware of [place] driving laws to stop driving until 6 months seizure-free. We also discussed work restrictions, he cannot operate heavy machinery as well. FOllow-up in 3-4 months, call for any changes.    Follow Up Instructions:   -I discussed the assessment and treatment plan with the patient. The patient was provided an opportunity to ask questions and all were answered. The patient agreed with the plan and demonstrated an understanding of the instructions.   The patient was advised to call back or seek an in-person evaluation if the symptoms worsen or if the condition fails to improve as anticipated.     Cameron Sprang, MD

## 2022-03-12 NOTE — Telephone Encounter (Signed)
Patient needs a note to state that he can go back to work with no restrictions

## 2022-03-12 NOTE — Telephone Encounter (Signed)
Patient had a seizure this pass weekend

## 2022-03-13 ENCOUNTER — Telehealth: Payer: Self-pay | Admitting: Anesthesiology

## 2022-03-13 ENCOUNTER — Encounter: Payer: Self-pay | Admitting: Neurology

## 2022-03-13 NOTE — Telephone Encounter (Signed)
Pt called stating he  would like to speak to Dr Aquino's nurse regarding his work situation, he didn't want to give anymore details.

## 2022-03-13 NOTE — Telephone Encounter (Signed)
Pt called sign letter will be placed up front for him to come by and picked up today

## 2022-03-16 ENCOUNTER — Telehealth: Payer: Self-pay | Admitting: Neurology

## 2022-03-16 NOTE — Telephone Encounter (Signed)
Return to work with restrictions form filled, thanks

## 2022-03-16 NOTE — Telephone Encounter (Signed)
Patient is calling to check on the paper work that needs to go back to work please call patient

## 2022-03-16 NOTE — Telephone Encounter (Signed)
Pt called in and left a message with the access nurse on 03/14/22. He needs a return to work note.

## 2022-03-16 NOTE — Telephone Encounter (Signed)
Pt called informed that paperwork is ready an at the front desk for him to pick up

## 2022-04-20 ENCOUNTER — Telehealth: Payer: Self-pay | Admitting: Neurology

## 2022-04-20 NOTE — Telephone Encounter (Signed)
Pt called in and left a message with the access nurse. He has a headache after taking his medicine and would like to find out if he can take Tylenol?

## 2022-04-20 NOTE — Telephone Encounter (Signed)
Pt called no answer when he calls back we will advise him to try taken his medication with food to see if that helps and if he needs tylenol he can take t no mor than 3 times a week

## 2022-04-21 NOTE — Telephone Encounter (Signed)
Pt called his headache was not due to medication it was because he didn't eat. He was advised to eat to make sure he takes his medication and   if he needs tylenol he can take  no more than 3 times a week. Pt verbalized understanding

## 2022-06-11 ENCOUNTER — Other Ambulatory Visit: Payer: Self-pay | Admitting: Neurology

## 2022-06-22 ENCOUNTER — Other Ambulatory Visit: Payer: Self-pay

## 2022-06-22 ENCOUNTER — Other Ambulatory Visit: Payer: Self-pay | Admitting: Neurology

## 2022-06-22 MED ORDER — LEVETIRACETAM ER 500 MG PO TB24: ORAL_TABLET | ORAL | 0 refills | Status: DC

## 2022-07-22 ENCOUNTER — Encounter: Payer: Self-pay | Admitting: Neurology

## 2022-07-22 ENCOUNTER — Ambulatory Visit: Payer: Self-pay | Admitting: Neurology

## 2022-07-22 DIAGNOSIS — Z029 Encounter for administrative examinations, unspecified: Secondary | ICD-10-CM

## 2022-09-22 ENCOUNTER — Other Ambulatory Visit: Payer: Self-pay | Admitting: Neurology

## 2022-09-22 ENCOUNTER — Other Ambulatory Visit: Payer: Self-pay

## 2022-09-22 MED ORDER — LEVETIRACETAM ER 500 MG PO TB24: ORAL_TABLET | ORAL | 1 refills | Status: DC

## 2023-04-06 ENCOUNTER — Ambulatory Visit: Payer: Self-pay | Admitting: Neurology

## 2023-04-29 ENCOUNTER — Telehealth: Payer: Self-pay | Admitting: Neurology

## 2023-04-29 MED ORDER — LEVETIRACETAM ER 500 MG PO TB24: ORAL_TABLET | ORAL | 3 refills | Status: DC

## 2023-04-29 MED ORDER — OXCARBAZEPINE 300 MG PO TABS: ORAL_TABLET | ORAL | 5 refills | Status: DC

## 2023-04-29 NOTE — Telephone Encounter (Signed)
 1. Which medications need refilled? (List name and dosage, if known) oxcarbazepine  2. Which pharmacy/location is medication to be sent to? (include street and city if local pharmacy) [facility] [address] [place], [place] [address]

## 2023-04-29 NOTE — Telephone Encounter (Signed)
 Rx sent, thanks

## 2023-08-02 ENCOUNTER — Telehealth: Payer: Self-pay | Admitting: Neurology

## 2023-08-02 NOTE — Telephone Encounter (Signed)
 Pt. Moved to [employer] and would like a referral w/ doctors notes sent to [facility] fax#[phone] ASAP

## 2023-08-03 ENCOUNTER — Telehealth: Payer: Self-pay | Admitting: Neurology

## 2023-08-03 DIAGNOSIS — G40009 Localization-related (focal) (partial) idiopathic epilepsy and epileptic syndromes with seizures of localized onset, not intractable, without status epilepticus: Secondary | ICD-10-CM

## 2023-08-03 NOTE — Telephone Encounter (Signed)
 Pt is coming in to see Dr Ty Gales on 7/14 for a follow up to see if he can be cleared for work,  New referral faxed to office in [place]

## 2023-08-03 NOTE — Telephone Encounter (Signed)
 I have not seen him since January 2024 and cannot do clearance without evaluation. Ok to send referral to new neuro, thanks

## 2023-08-03 NOTE — Telephone Encounter (Signed)
 See other phone note

## 2023-08-03 NOTE — Telephone Encounter (Signed)
 Pt. Called back checking on Referral, please call Pt. Back to provide update

## 2023-08-03 NOTE — Telephone Encounter (Signed)
 Pt called in wanting to find out how he can be cleared to go back to work?

## 2023-08-03 NOTE — Addendum Note (Signed)
 Addended by: Erica Hau on: 08/03/2023 02:14 PM   Modules accepted: Orders

## 2023-08-30 ENCOUNTER — Encounter: Payer: Self-pay | Admitting: Neurology

## 2023-08-30 ENCOUNTER — Ambulatory Visit (INDEPENDENT_AMBULATORY_CARE_PROVIDER_SITE_OTHER): Payer: Self-pay | Admitting: Neurology

## 2023-08-30 VITALS — BP 119/72 | HR 74 | Ht 71.0 in | Wt 162.0 lb

## 2023-08-30 DIAGNOSIS — G40009 Localization-related (focal) (partial) idiopathic epilepsy and epileptic syndromes with seizures of localized onset, not intractable, without status epilepticus: Secondary | ICD-10-CM

## 2023-08-30 MED ORDER — OXCARBAZEPINE 300 MG PO TABS: ORAL_TABLET | ORAL | 3 refills | Status: AC

## 2023-08-30 NOTE — Patient Instructions (Signed)
 Good to see you!  Continue Oxcarbazepine  300mg : take 1 tablet twice a day  2. Please call [facility] in [place] at [phone] and let them know they should have a referral so you can establish care closer to home  3. Follow-up as needed, call for any changes   Seizure Precautions: 1. If medication has been prescribed for you to prevent seizures, take it exactly as directed.  Do not stop taking the medicine without talking to your doctor first, even if you have not had a seizure in a long time.   2. Avoid activities in which a seizure would cause danger to yourself or to others.  Don't operate dangerous machinery, swim alone, or climb in high or dangerous places, such as on ladders, roofs, or girders.  Do not drive unless your doctor says you may.  3. If you have any warning that you may have a seizure, lay down in a safe place where you can't hurt yourself.    4.  No driving for 6 months from last seizure, as per [place]  state law.   Please refer to the following link on the Epilepsy Foundation of America's website for more information: http://www.epilepsyfoundation.org/answerplace/Social/driving/drivingu.cfm   5.  Maintain good sleep hygiene. Avoid alcohol.  6.  Contact your doctor if you have any problems that may be related to the medicine you are taking.  7.  Call 911 and bring the patient back to the ED if:        A.  The seizure lasts longer than 5 minutes.       B.  The patient doesn't awaken shortly after the seizure  C.  The patient has new problems such as difficulty seeing, speaking or moving  D.  The patient was injured during the seizure  E.  The patient has a temperature over 102 F (39C)  F.  The patient vomited and now is having trouble breathing

## 2023-08-30 NOTE — Progress Notes (Signed)
 NEUROLOGY FOLLOW UP OFFICE NOTE  Jason Davenport [id number] 31-Mar-1990  HISTORY OF PRESENT ILLNESS: I had the pleasure of seeing Jason Davenport in follow-up in the neurology clinic on 08/30/2023.  The patient was last seen in January 2024 for epilepsy. He is alone in the office today. Records and images were personally reviewed where available.  On his last visit, he reported continued seizures on low dose Keppra  XR, as well as side effects. He was started on Oxcarbazepine  300mg  BID and instructed to increase to 600mg  BID however he was lost to follow-up and has only been taking 300mg  BID. He denies any seizures since January 2024. He denies any staring/unresponsive episodes, gaps in time, olfactory/gustatory hallucinations, focal numbness/tingling/weakness, myoclonic jerks. No headaches, dizziness, vision changes. No side effects on Oxcarbazepine  300mg  BID. He presents today asking for clearance to return to work after a syncopal episode that occurred at work in 07/30/23. He was halfway through his shift at night and denies missing any meals and was staying hydrated. He recalls shutting the machine down then feeling hot. He recalls trying to get water then waking up on the floor. Coworkers reported he was not out long, around 3-4 minutes, with no convulsive activity seen. No tongue bite, incontinence, focal weakness, shoulder pain. He denies missing medication, sleep deprivation, or alcohol use. He did not have the usual post-ictal confusion and knew what was going on when he came to. No further syncopal episodes since then. 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 He denies any palpitations, shortness of breath, chest pain. He usually gets 8-10 hours of sleep. He lives with his parents. Mood is great.    History on Initial Assessment 08/17/2019: This is a pleasant 33 year old right-handed man presenting to establish care for seizures. His first seizure in 01/2018 was a  nocturnal seizure. He had another seizure in 08/2018, then 09/2018 and was started on Levetiracetam  500mg  BID at that time. He had a left shoulder dislocation and has had issues with this since then. He usually goes to the ER when he has a seizure and was in the ER on 02/08/19, then 06/17/19, and most recently 07/13/19 when he had 2 seizures in one day. With the seizures in May, he had stopped his Keppra  previously. He states he just forgot to take it. He has been taking Levetiracetam  ER 500mg  BID since May and denies any further seizures. He recalls taking 1000mg  BID at one point but he was not feeling himself. With the seizures, he usually feels like he is getting dehydrated, sweating really hard and feeling lightheaded, then having a GTC. Witnesses have reported he stares off first before a convulsion. He has bitten his tongue and dislocated left shoulder again with the seizures. He states surgery is on hold until seizures are controlled. He denies any gaps in time, olfactory/gustatory hallucinations, deja vu, rising epigastric sensation, focal numbness/tingling/weakness, myoclonic jerks. No headaches, diplopia, dysarthria/dysphagia, neck/back pain, bowel/bladder dysfunction. Memory is good. He denies drinking alcohol and denies any sleep difficulties. Mood is good. He is currently unemployed.   Epilepsy Risk Factors: He had a normal birth and early development.  There is no history of febrile convulsions, CNS infections such as meningitis/encephalitis, significant traumatic brain injury, neurosurgical procedures, or family history of seizures.  Diagnostic Data: EEGs: normal wake EEG in 08/2018 MRI: I personally reviewed MRI brain with and without contrast done 08/2018 which did not show any acute changes. Hippocampi symmetric  with no abnormal signal or enhancement seen.   PAST MEDICAL HISTORY: Past Medical History:  Diagnosis Date   Seizure [facility])     MEDICATIONS: Current Outpatient Medications on File  Prior to Visit  Medication Sig Dispense Refill   Oxcarbazepine  (TRILEPTAL ) 300 MG tablet Take 2 tablets twice a day 120 tablet 5   levETIRAcetam  (KEPPRA  XR) 500 MG 24 hr tablet Take 1 tablet by mouth nightly (Patient not taking: Reported on 08/30/2023) 90 tablet 3   No current facility-administered medications on file prior to visit.    ALLERGIES: No Known Allergies  FAMILY HISTORY: Family History  Problem Relation Age of Onset   Hypertension Mother    Hypertension Father     SOCIAL HISTORY: Social History   Socioeconomic History   Marital status: Single    Spouse name: Not on file   Number of children: 1   Years of education: some college   Highest education level: Not on file  Occupational History   Occupation: Not working right now  Tobacco Use   Smoking status: Every Day    Current packs/day: 1.00    Types: Cigarettes   Smokeless tobacco: Never   Tobacco comments:    1 pack a day  Vaping Use   Vaping status: Never Used  Substance and Sexual Activity   Alcohol use: Yes    Comment: occ   Drug use: Yes    Types: Marijuana   Sexual activity: Not on file  Other Topics Concern   Not on file  Social History Narrative   Right-handed.   3-4 cups caffeine per day.   Lives at home alone.   Third floor   Social Drivers of Health   Financial Resource Strain: Not on file  Food Insecurity: Not on file  Transportation Needs: Not on file  Physical Activity: Not on file  Stress: Not on file  Social Connections: Unknown (09/22/2021)   [facility]   Social Network    Social Network: Not on file  Intimate Partner Violence: Unknown (09/22/2021)   Received from [facility]   HITS    Physically Hurt: Not on file    Insult or Talk Down To: Not on file    Threaten Physical Harm: Not on file    Scream or Curse: Not on file     PHYSICAL EXAM: Vitals:   08/30/23 1530  BP: 119/72  Pulse: 74  SpO2: 99%   General: No acute distress Head:   Normocephalic/atraumatic Skin/Extremities: No rash, no edema Neurological Exam: alert and awake. No aphasia or dysarthria. Fund of knowledge is appropriate.  Attention and concentration are normal.   Cranial nerves: Pupils equal, round. Extraocular movements intact with no nystagmus. Visual fields full.  No facial asymmetry.  Motor: Bulk and tone normal, muscle strength 5/5 throughout with no pronator drift.   Finger to nose testing intact.  Gait narrow-based and steady, able to tandem walk adequately.  Romberg negative.   IMPRESSION: This is a pleasant 33 yo RH man with a history of seizures since 2019. 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 MRI brain and routine EEG normal. He denies any typical seizures since January 2024, he states he has only been taking Oxcarbazepine  300mg  BID with no issues. He had a syncopal episode on 07/30/23 at work suggestive of vasovagal syncope. We had an extensive discussion that if seizures recur on current dose of oxcarbazepine , there is a low threshold to increase dose.  We discussed avoidance of seizure triggers. 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 We discussed that he may return to work tomorrow with no restrictions. He is aware of [place] driving laws to stop driving after a seizure until 6 months seizure-free. He lives in [place] and was advised to follow-up on referral sent to [facility] closer to home for continued neurological care. Follow-up as needed, he knows to call for any changes.   Thank you for allowing me to participate in his care.  Please do not hesitate to call for any questions or concerns.    Darice Shivers, M.D.

## 2023-11-30 ENCOUNTER — Ambulatory Visit: Payer: Self-pay | Admitting: Neurology

## 2023-11-30 ENCOUNTER — Encounter: Payer: Self-pay | Admitting: Neurology
